# Patient Record
Sex: Female | Born: 1975 | Race: Black or African American | Hispanic: No | Smoking: Never smoker
Health system: Southern US, Community
[De-identification: ages and names within clinical notes are randomized; demographics above are authoritative.]

## PROBLEM LIST (undated history)

## (undated) DIAGNOSIS — N920 Excessive and frequent menstruation with regular cycle: Secondary | ICD-10-CM

## (undated) DIAGNOSIS — D649 Anemia, unspecified: Secondary | ICD-10-CM

## (undated) DIAGNOSIS — I82409 Acute embolism and thrombosis of unspecified deep veins of unspecified lower extremity: Secondary | ICD-10-CM

## (undated) DIAGNOSIS — Z5189 Encounter for other specified aftercare: Secondary | ICD-10-CM

## (undated) HISTORY — DX: Excessive and frequent menstruation with regular cycle: N92.0

## (undated) HISTORY — DX: Acute embolism and thrombosis of unspecified deep veins of unspecified lower extremity: I82.409

## (undated) HISTORY — PX: OTHER SURGICAL HISTORY: SHX169

## (undated) HISTORY — DX: Anemia, unspecified: D64.9

---

## 2001-01-19 ENCOUNTER — Emergency Department (HOSPITAL_COMMUNITY): Admission: EM | Admit: 2001-01-19 | Discharge: 2001-01-19 | Payer: Self-pay | Admitting: *Deleted

## 2002-01-13 ENCOUNTER — Emergency Department (HOSPITAL_COMMUNITY): Admission: EM | Admit: 2002-01-13 | Discharge: 2002-01-13 | Payer: Self-pay | Admitting: *Deleted

## 2002-04-11 ENCOUNTER — Emergency Department (HOSPITAL_COMMUNITY): Admission: EM | Admit: 2002-04-11 | Discharge: 2002-04-11 | Payer: Self-pay | Admitting: Emergency Medicine

## 2008-09-03 LAB — HM PAP SMEAR: HM Pap smear: NORMAL

## 2010-07-18 ENCOUNTER — Emergency Department (HOSPITAL_BASED_OUTPATIENT_CLINIC_OR_DEPARTMENT_OTHER)
Admission: EM | Admit: 2010-07-18 | Discharge: 2010-07-19 | Disposition: A | Discharge status: Home / Self Care | Attending: Emergency Medicine | Admitting: Emergency Medicine

## 2010-07-18 DIAGNOSIS — K089 Disorder of teeth and supporting structures, unspecified: Secondary | ICD-10-CM | POA: Insufficient documentation

## 2010-07-18 DIAGNOSIS — K029 Dental caries, unspecified: Secondary | ICD-10-CM | POA: Insufficient documentation

## 2010-07-18 DIAGNOSIS — E119 Type 2 diabetes mellitus without complications: Secondary | ICD-10-CM | POA: Insufficient documentation

## 2011-09-05 DIAGNOSIS — G4733 Obstructive sleep apnea (adult) (pediatric): Secondary | ICD-10-CM | POA: Insufficient documentation

## 2011-11-27 HISTORY — PX: GASTRIC BYPASS: SHX52

## 2011-12-25 ENCOUNTER — Encounter (HOSPITAL_COMMUNITY): Payer: Self-pay | Admitting: Emergency Medicine

## 2011-12-25 ENCOUNTER — Emergency Department (HOSPITAL_COMMUNITY)

## 2011-12-25 ENCOUNTER — Emergency Department (HOSPITAL_COMMUNITY)
Admission: EM | Admit: 2011-12-25 | Discharge: 2011-12-26 | Disposition: A | Discharge status: Home / Self Care | Attending: Emergency Medicine | Admitting: Emergency Medicine

## 2011-12-25 DIAGNOSIS — K9581 Infection due to other bariatric procedure: Secondary | ICD-10-CM | POA: Insufficient documentation

## 2011-12-25 DIAGNOSIS — Y838 Other surgical procedures as the cause of abnormal reaction of the patient, or of later complication, without mention of misadventure at the time of the procedure: Secondary | ICD-10-CM | POA: Insufficient documentation

## 2011-12-25 DIAGNOSIS — L03319 Cellulitis of trunk, unspecified: Secondary | ICD-10-CM | POA: Insufficient documentation

## 2011-12-25 DIAGNOSIS — N390 Urinary tract infection, site not specified: Secondary | ICD-10-CM | POA: Insufficient documentation

## 2011-12-25 DIAGNOSIS — K7689 Other specified diseases of liver: Secondary | ICD-10-CM | POA: Insufficient documentation

## 2011-12-25 DIAGNOSIS — L02219 Cutaneous abscess of trunk, unspecified: Secondary | ICD-10-CM | POA: Insufficient documentation

## 2011-12-25 DIAGNOSIS — R509 Fever, unspecified: Secondary | ICD-10-CM | POA: Insufficient documentation

## 2011-12-25 DIAGNOSIS — T8149XA Infection following a procedure, other surgical site, initial encounter: Secondary | ICD-10-CM

## 2011-12-25 LAB — URINALYSIS, ROUTINE W REFLEX MICROSCOPIC
Ketones, ur: >80 mg/dL — AB
Nitrite: NEGATIVE
Protein, ur: 100 mg/dL — AB
Urobilinogen, UA: 1 mg/dL (ref 0.0–1.0)

## 2011-12-25 LAB — CBC WITH DIFFERENTIAL/PLATELET
Basophils Absolute: 0 10*3/uL (ref 0.0–0.1)
Eosinophils Relative: 0 % (ref 0–5)
HCT: 35.7 % — ABNORMAL LOW (ref 36.0–46.0)
Lymphs Abs: 1.6 10*3/uL (ref 0.7–4.0)
MCH: 24.9 pg — ABNORMAL LOW (ref 26.0–34.0)
MCV: 76 fL — ABNORMAL LOW (ref 78.0–100.0)
Monocytes Absolute: 1 10*3/uL (ref 0.1–1.0)
Monocytes Relative: 6 % (ref 3–12)
Neutro Abs: 13.8 10*3/uL — ABNORMAL HIGH (ref 1.7–7.7)
Platelets: 289 10*3/uL (ref 150–400)
RDW: 16.1 % — ABNORMAL HIGH (ref 11.5–15.5)

## 2011-12-25 LAB — URINE MICROSCOPIC-ADD ON

## 2011-12-25 LAB — COMPREHENSIVE METABOLIC PANEL
ALT: 15 U/L (ref 0–35)
Alkaline Phosphatase: 67 U/L (ref 39–117)
BUN: 4 mg/dL — ABNORMAL LOW (ref 6–23)
CO2: 25 mEq/L (ref 19–32)
GFR calc Af Amer: >90 mL/min (ref >90)
GFR calc non Af Amer: >90 mL/min (ref >90)
Glucose, Bld: 110 mg/dL — ABNORMAL HIGH (ref 70–99)
Potassium: 3 mEq/L — ABNORMAL LOW (ref 3.5–5.1)
Sodium: 138 mEq/L (ref 135–145)
Total Protein: 7.9 g/dL (ref 6.0–8.3)

## 2011-12-25 MED ORDER — SODIUM CHLORIDE 0.9 % IV BOLUS (SEPSIS)
1000.0000 mL | Freq: Once | INTRAVENOUS | Status: AC
  Administered 2011-12-25: 1000 mL via INTRAVENOUS

## 2011-12-25 MED ORDER — IOHEXOL 300 MG/ML  SOLN: 20.0000 mL | Freq: Once | INTRAMUSCULAR | Status: DC | PRN

## 2011-12-25 MED ORDER — IOHEXOL 300 MG/ML  SOLN
100.0000 mL | Freq: Once | INTRAMUSCULAR | Status: AC | PRN
  Administered 2011-12-25: 100 mL via INTRAVENOUS

## 2011-12-25 MED ORDER — ACETAMINOPHEN 325 MG PO TABS
650.0000 mg | ORAL_TABLET | Freq: Once | ORAL | Status: AC
  Administered 2011-12-25: 650 mg via ORAL
  Filled 2011-12-25: qty 2

## 2011-12-25 MED ORDER — IOHEXOL 300 MG/ML  SOLN
20.0000 mL | Freq: Once | INTRAMUSCULAR | Status: AC | PRN
  Administered 2011-12-25: 20 mL via ORAL

## 2011-12-25 NOTE — ED Provider Notes (Signed)
History     CSN: [id number]  Arrival date & time 12/25/11  1659   First MD Initiated Contact with Patient 12/25/11 1927      Chief Complaint  Patient presents with  . Wound Infection    (Consider location/radiation/quality/duration/timing/severity/associated sxs/prior treatment) Patient is a 36 y.o. female presenting with abdominal pain. The history is provided by the patient.  Abdominal Pain The primary symptoms of the illness include abdominal pain, fever, fatigue and nausea. The primary symptoms of the illness do not include shortness of breath, vomiting, diarrhea, hematemesis, hematochezia, dysuria or vaginal discharge. The current episode started yesterday. The onset of the illness was gradual. The problem has been gradually worsening.  The abdominal pain is located in the RUQ. The abdominal pain does not radiate.  Risk factors for an acute abdominal problem include a history of abdominal surgery. Additional symptoms associated with the illness include chills and anorexia. Symptoms associated with the illness do not include diaphoresis, hematuria or back pain.    History reviewed. No pertinent past medical history.  Past Surgical History  Procedure Date  . Gastric bypass     History reviewed. No pertinent family history.  History  Substance Use Topics  . Smoking status: Never Smoker   . Smokeless tobacco: Not on file  . Alcohol Use: No    OB History    Grav Para Term Preterm Abortions TAB SAB Ect Mult Living                  Review of Systems  Constitutional: Positive for fever, chills and fatigue. Negative for diaphoresis.  HENT: Negative for ear pain, congestion, sore throat, facial swelling, mouth sores, trouble swallowing, neck pain and neck stiffness.   Eyes: Negative.   Respiratory: Negative for apnea, cough, chest tightness, shortness of breath and wheezing.   Cardiovascular: Negative for chest pain, palpitations and leg swelling.  Gastrointestinal:  Positive for nausea, abdominal pain and anorexia. Negative for vomiting, diarrhea, blood in stool, hematochezia, abdominal distention and hematemesis.  Genitourinary: Negative for dysuria, hematuria, flank pain, vaginal discharge, difficulty urinating and menstrual problem.  Musculoskeletal: Negative for back pain and gait problem.  Skin: Positive for rash. Negative for wound.  Neurological: Negative for dizziness, tremors, seizures, syncope, facial asymmetry, numbness and headaches.  Psychiatric/Behavioral: Negative.   All other systems reviewed and are negative.    Allergies  Review of patient's allergies indicates no known allergies.  Home Medications   Current Outpatient Rx  Name Route Sig Dispense Refill  . DOCUSATE SODIUM 100 MG PO CAPS Oral Take 100 mg by mouth 2 (two) times daily.    Marland Kitchen HYDROCODONE-ACETAMINOPHEN 7.5-325 MG/15ML PO SOLN Oral Take 15 mLs by mouth 4 (four) times daily as needed. For pain    . OMEPRAZOLE 20 MG PO CPDR Oral Take 20 mg by mouth daily.    Marland Kitchen PROMETHAZINE HCL 25 MG PO TABS Oral Take 25 mg by mouth every 6 (six) hours as needed. For nausea    . URSODIOL 300 MG PO CAPS Oral Take 300 mg by mouth 2 (two) times daily.      BP 102/62  Pulse 93  Temp 98.1 F (36.7 C) (Oral)  Resp 33  SpO2 97%  LMP 09/25/2011  Physical Exam  Nursing note and vitals reviewed. Constitutional: She is oriented to person, place, and time. She appears well-developed and well-nourished. No distress.  HENT:  Head: Normocephalic and atraumatic.  Right Ear: External ear normal.  Left Ear: External ear normal.  Nose: Nose normal.  Mouth/Throat: Oropharynx is clear and moist. No oropharyngeal exudate.  Eyes: Conjunctivae and EOM are normal. Pupils are equal, round, and reactive to light. Right eye exhibits no discharge. Left eye exhibits no discharge.  Neck: Normal range of motion. Neck supple. No JVD present. No tracheal deviation present. No thyromegaly present.    Cardiovascular: Normal rate, regular rhythm, normal heart sounds and intact distal pulses.  Exam reveals no gallop and no friction rub.   No murmur heard. Pulmonary/Chest: Effort normal and breath sounds normal. No respiratory distress. She has no wheezes. She has no rales. She exhibits no tenderness.  Abdominal: Soft. Bowel sounds are normal. She exhibits no distension. There is tenderness (tenderness to palpation around surgical incision). There is no rebound and no guarding.         Patient with pain with palpation around surgical incision. Surgical incision is surrounded by warmth erythema induration but no obvious fluctuance. Surgical wound with purulent discharge  Musculoskeletal: Normal range of motion.  Lymphadenopathy:    She has no cervical adenopathy.  Neurological: She is alert and oriented to person, place, and time. No cranial nerve deficit. Coordination normal.  Skin: Skin is warm. No rash noted. She is not diaphoretic.  Psychiatric: She has a normal mood and affect. Her behavior is normal. Judgment and thought content normal.    ED Course  Procedures (including critical care time)  Labs Reviewed  CBC WITH DIFFERENTIAL - Abnormal; Notable for the following:    WBC 16.4 (*)     Hemoglobin 11.7 (*)     HCT 35.7 (*)     MCV 76.0 (*)     MCH 24.9 (*)     RDW 16.1 (*)     Neutrophils Relative 84 (*)     Lymphocytes Relative 10 (*)     Neutro Abs 13.8 (*)     All other components within normal limits  COMPREHENSIVE METABOLIC PANEL - Abnormal; Notable for the following:    Potassium 3.0 (*)     Glucose, Bld 110 (*)     BUN 4 (*)     Albumin 2.8 (*)     All other components within normal limits  URINALYSIS, ROUTINE W REFLEX MICROSCOPIC - Abnormal; Notable for the following:    Color, Urine AMBER (*)  BIOCHEMICALS MAY BE AFFECTED BY COLOR   APPearance CLOUDY (*)     Bilirubin Urine MODERATE (*)     Ketones, ur >80 (*)     Protein, ur 100 (*)     Leukocytes, UA SMALL  (*)     All other components within normal limits  URINE MICROSCOPIC-ADD ON - Abnormal; Notable for the following:    Squamous Epithelial / LPF FEW (*)     Bacteria, UA MANY (*)     Casts HYALINE CASTS (*)  GRANULAR CAST   All other components within normal limits   Ct Abdomen Pelvis W Contrast  12/25/2011  *RADIOLOGY REPORT*  Clinical Data: Recent gastric bypass surgery, fever, abdominal pain and drainage from surgical site.  CT ABDOMEN AND PELVIS WITH CONTRAST  Technique:  Multidetector CT imaging of the abdomen and pelvis was performed following the standard protocol during bolus administration of intravenous contrast.  Contrast: 20mL OMNIPAQUE IOHEXOL 300 MG/ML  SOLN, OMNIPAQUE IOHEXOL 300 MG/ML  SOLN  Comparison: None.  Findings: Limited images through the lung bases demonstrate no significant appreciable abnormality. The heart size is within normal limits. No pleural or pericardial effusion.  Hepatic steatosis.  Punctate calcification within the right hepatic lobe is nonspecific.  Distended gallbladder.  No pericholecystic fluid or radiodense gallstones.  Splenomegaly, measuring 15 cm craniocaudal.  Unremarkable pancreas and adrenal glands.  Symmetric renal enhancement.  No hydronephrosis or hydroureter.  No bowel obstruction.  No CT evidence for colitis.  There is mild colonic diverticulosis.  Status post gastric bypass.  Subcentimeter retroperitoneal lymph nodes are nonspecific.  No free intraperitoneal air or fluid.  Normal caliber vasculature.  Partially decompressed bladder.  Unremarkable CT appearance to the uterus and adnexa. Mildly prominent right inguinal lymph nodes, presumably reactive.  No acute osseous finding.  Mild thickening of the right rectus abdominus musculature.  Ill-defined fluid within the subcutaneous fat of the right anterior abdomen.  IMPRESSION: Ill-defined fluid within the right anterior subcutaneous fat with stranding of the surrounding subcutaneous fat. This may  represent cellulitis and/or postoperative changes. A developing abscess is not excluded, though no well defined/walled off collection at this time.  No intraperitoneal communication.  Hepatic steatosis.  Splenomegaly.  Original Report Authenticated By: Waneta Martins, M.D.     No diagnosis found.    MDM  36 year old female who had gastric bypass surgery one week ago presents with surgical wound infection. Patient noticed symptoms yesterday such as worsening redness induration pain and purulent discharge around surgical wound in right upper quadrant of abdomen. Patient also feeling nauseated but no vomiting patient with fevers. Patient denies urinary symptoms. Patient was some evidence of possible urinary tract infection but does not experience hesitancy frequency or dysuria. Patient with no cough congestion or signs of possible pneumonia as cause of infection. We'll get CT of wound and likely patient will need transporting to [facility] where her primary surgeons are located.  12:36 AM I spoke with Dr.Reid the minimally invasive surgery fellow at [facility] who spoke with Dr. Lowell Guitar the attending surgeon physician on call about this case. After lengthy conversation given patient's hemodynamic stability and well appearance and normalizing vital signs with no evidence of an abscess or intra-abdominal pathology patient will be given a dose of IV Ancef to cover possible sources of the cellulitic postoperative infection and IV ciprofloxacin for her evidence of possible postoperative urinary tract infection. Surgeon's will work patient into their schedule in the office tomorrow. I will instruct patient to call to make an appointment tomorrow and to return immediately to the emergency department with worsening pain nausea vomiting worsening drainage. Patient is in accordance with this plan  Results for orders placed during the hospital encounter of 12/25/11  CBC WITH DIFFERENTIAL       Component Value Range   WBC 16.4 (*) 4.0 - 10.5 K/uL   RBC 4.70  3.87 - 5.11 MIL/uL   Hemoglobin 11.7 (*) 12.0 - 15.0 g/dL   HCT 16.1 (*) 09.6 - 04.5 %   MCV 76.0 (*) 78.0 - 100.0 fL   MCH 24.9 (*) 26.0 - 34.0 pg   MCHC 32.8  30.0 - 36.0 g/dL   RDW 40.9 (*) 81.1 - 91.4 %   Platelets 289  150 - 400 K/uL   Neutrophils Relative 84 (*) 43 - 77 %   Lymphocytes Relative 10 (*) 12 - 46 %   Monocytes Relative 6  3 - 12 %   Eosinophils Relative 0  0 - 5 %   Basophils Relative 0  0 - 1 %   Neutro Abs 13.8 (*) 1.7 - 7.7 K/uL   Lymphs Abs 1.6  0.7 -  4.0 K/uL   Monocytes Absolute 1.0  0.1 - 1.0 K/uL   Eosinophils Absolute 0.0  0.0 - 0.7 K/uL   Basophils Absolute 0.0  0.0 - 0.1 K/uL   RBC Morphology POLYCHROMASIA PRESENT     WBC Morphology ATYPICAL LYMPHOCYTES     Smear Review LARGE PLATELETS PRESENT    COMPREHENSIVE METABOLIC PANEL      Component Value Range   Sodium 138  135 - 145 mEq/L   Potassium 3.0 (*) 3.5 - 5.1 mEq/L   Chloride 97  96 - 112 mEq/L   CO2 25  19 - 32 mEq/L   Glucose, Bld 110 (*) 70 - 99 mg/dL   BUN 4 (*) 6 - 23 mg/dL   Creatinine, Ser 1.61  0.50 - 1.10 mg/dL   Calcium 9.0  8.4 - 09.6 mg/dL   Total Protein 7.9  6.0 - 8.3 g/dL   Albumin 2.8 (*) 3.5 - 5.2 g/dL   AST 18  0 - 37 U/L   ALT 15  0 - 35 U/L   Alkaline Phosphatase 67  39 - 117 U/L   Total Bilirubin 0.5  0.3 - 1.2 mg/dL   GFR calc non Af Amer >90  >90 mL/min   GFR calc Af Amer >90  >90 mL/min  URINALYSIS, ROUTINE W REFLEX MICROSCOPIC      Component Value Range   Color, Urine AMBER (*) YELLOW   APPearance CLOUDY (*) CLEAR   Specific Gravity, Urine 1.028  1.005 - 1.030   pH 6.0  5.0 - 8.0   Glucose, UA NEGATIVE  NEGATIVE mg/dL   Hgb urine dipstick NEGATIVE  NEGATIVE   Bilirubin Urine MODERATE (*) NEGATIVE   Ketones, ur >80 (*) NEGATIVE mg/dL   Protein, ur 045 (*) NEGATIVE mg/dL   Urobilinogen, UA 1.0  0.0 - 1.0 mg/dL   Nitrite NEGATIVE  NEGATIVE   Leukocytes, UA SMALL (*) NEGATIVE  URINE  MICROSCOPIC-ADD ON      Component Value Range   Squamous Epithelial / LPF FEW (*) RARE   WBC, UA 7-10  <3 WBC/hpf   RBC / HPF 0-2  <3 RBC/hpf   Bacteria, UA MANY (*) RARE   Casts HYALINE CASTS (*) NEGATIVE   Urine-Other MUCOUS PRESENT         CT Abdomen Pelvis W Contrast (Final result)   Result time:12/25/11 2303    Final result by Rad Results In Interface (12/25/11 23:03:09)    Narrative:   *RADIOLOGY REPORT*  Clinical Data: Recent gastric bypass surgery, fever, abdominal pain and drainage from surgical site.  CT ABDOMEN AND PELVIS WITH CONTRAST  Technique: Multidetector CT imaging of the abdomen and pelvis was performed following the standard protocol during bolus administration of intravenous contrast.  Contrast: 20mL OMNIPAQUE IOHEXOL 300 MG/ML SOLN, OMNIPAQUE IOHEXOL 300 MG/ML SOLN  Comparison: None.  Findings: Limited images through the lung bases demonstrate no significant appreciable abnormality. The heart size is within normal limits. No pleural or pericardial effusion.  Hepatic steatosis. Punctate calcification within the right hepatic lobe is nonspecific. Distended gallbladder. No pericholecystic fluid or radiodense gallstones. Splenomegaly, measuring 15 cm craniocaudal. Unremarkable pancreas and adrenal glands.  Symmetric renal enhancement. No hydronephrosis or hydroureter.  No bowel obstruction. No CT evidence for colitis. There is mild colonic diverticulosis. Status post gastric bypass.  Subcentimeter retroperitoneal lymph nodes are nonspecific. No free intraperitoneal air or fluid.  Normal caliber vasculature.  Partially decompressed bladder. Unremarkable CT appearance to the uterus and adnexa. Mildly prominent right  inguinal lymph nodes, presumably reactive.  No acute osseous finding. Mild thickening of the right rectus abdominus musculature.  Ill-defined fluid within the subcutaneous fat of the right  anterior abdomen.  IMPRESSION: Ill-defined fluid within the right anterior subcutaneous fat with stranding of the surrounding subcutaneous fat. This may represent cellulitis and/or postoperative changes. A developing abscess is not excluded, though no well defined/walled off collection at this time. No intraperitoneal communication.  Hepatic steatosis.  Splenomegaly.  Original Report Authenticated By: Waneta Martins, M.D.     Case discussed with Dr. Bobby Rumpf, MD 12/26/11 [phone]

## 2011-12-25 NOTE — ED Notes (Signed)
States had gastric bypass 7/17 now having redness and fever not feeling well

## 2011-12-25 NOTE — ED Provider Notes (Signed)
I have seen and examined this patient with the resident.  I agree with the resident's note, assessment and plan except as indicated.    Patient with gastric bypass surgery performed one week ago at [facility]  Patient presents now with fever, tachycardia and some abdominal pain around her incision site.  She has noted redness and tenderness with around the site with some drainage present as well.  Patient will have his CT of her abdomen and pelvis performed to assess for correlating intra-abdominal infection.  I anticipate possible transfer to [facility] for further reevaluation by her surgeon.  Nat Christen, MD 12/25/11 2136

## 2011-12-25 NOTE — ED Notes (Signed)
Patient transported to CT 

## 2011-12-26 MED ORDER — CIPROFLOXACIN IN D5W 400 MG/200ML IV SOLN
400.0000 mg | Freq: Once | INTRAVENOUS | Status: AC
  Administered 2011-12-26: 400 mg via INTRAVENOUS
  Filled 2011-12-26: qty 200

## 2011-12-26 MED ORDER — CEPHALEXIN 500 MG PO CAPS: 500.0000 mg | ORAL_CAPSULE | Freq: Four times a day (QID) | ORAL | Status: AC

## 2011-12-26 MED ORDER — CEFAZOLIN SODIUM 1-5 GM-% IV SOLN
1.0000 g | Freq: Three times a day (TID) | INTRAVENOUS | Status: DC
  Administered 2011-12-26: 1 g via INTRAVENOUS
  Filled 2011-12-26: qty 50

## 2011-12-26 MED ORDER — CIPROFLOXACIN HCL 500 MG PO TABS: 500.0000 mg | ORAL_TABLET | Freq: Two times a day (BID) | ORAL | Status: AC

## 2011-12-28 NOTE — ED Provider Notes (Signed)
I have seen and examined this patient with the resident.  I agree with the resident's note, assessment and plan except as indicated.     Nat Christen, MD 12/28/11 [phone]

## 2012-01-01 DIAGNOSIS — Z9884 Bariatric surgery status: Secondary | ICD-10-CM | POA: Insufficient documentation

## 2012-04-19 ENCOUNTER — Emergency Department (HOSPITAL_BASED_OUTPATIENT_CLINIC_OR_DEPARTMENT_OTHER)
Admission: EM | Admit: 2012-04-19 | Discharge: 2012-04-19 | Disposition: A | Discharge status: Home / Self Care | Attending: Emergency Medicine | Admitting: Emergency Medicine

## 2012-04-19 ENCOUNTER — Encounter (HOSPITAL_BASED_OUTPATIENT_CLINIC_OR_DEPARTMENT_OTHER): Payer: Self-pay | Admitting: *Deleted

## 2012-04-19 DIAGNOSIS — R110 Nausea: Secondary | ICD-10-CM | POA: Insufficient documentation

## 2012-04-19 DIAGNOSIS — Z9884 Bariatric surgery status: Secondary | ICD-10-CM | POA: Insufficient documentation

## 2012-04-19 DIAGNOSIS — N390 Urinary tract infection, site not specified: Secondary | ICD-10-CM | POA: Insufficient documentation

## 2012-04-19 DIAGNOSIS — Z79899 Other long term (current) drug therapy: Secondary | ICD-10-CM | POA: Insufficient documentation

## 2012-04-19 DIAGNOSIS — M545 Low back pain, unspecified: Secondary | ICD-10-CM | POA: Insufficient documentation

## 2012-04-19 DIAGNOSIS — R350 Frequency of micturition: Secondary | ICD-10-CM | POA: Insufficient documentation

## 2012-04-19 LAB — URINALYSIS, ROUTINE W REFLEX MICROSCOPIC
Glucose, UA: NEGATIVE mg/dL
Ketones, ur: 15 mg/dL — AB
Nitrite: NEGATIVE
Specific Gravity, Urine: 1.029 (ref 1.005–1.030)
pH: 5 (ref 5.0–8.0)

## 2012-04-19 LAB — URINE MICROSCOPIC-ADD ON

## 2012-04-19 LAB — PREGNANCY, URINE: Preg Test, Ur: NEGATIVE

## 2012-04-19 MED ORDER — NITROFURANTOIN MONOHYD MACRO 100 MG PO CAPS
100.0000 mg | ORAL_CAPSULE | Freq: Once | ORAL | Status: AC
  Administered 2012-04-19: 100 mg via ORAL
  Filled 2012-04-19: qty 1

## 2012-04-19 MED ORDER — PHENAZOPYRIDINE HCL 100 MG PO TABS
200.0000 mg | ORAL_TABLET | Freq: Once | ORAL | Status: AC
  Administered 2012-04-19: 200 mg via ORAL
  Filled 2012-04-19: qty 2

## 2012-04-19 MED ORDER — NITROFURANTOIN MONOHYD MACRO 100 MG PO CAPS: 100.0000 mg | ORAL_CAPSULE | Freq: Two times a day (BID) | ORAL | Status: DC

## 2012-04-19 MED ORDER — PHENAZOPYRIDINE HCL 200 MG PO TABS: 200.0000 mg | ORAL_TABLET | Freq: Three times a day (TID) | ORAL | Status: DC

## 2012-04-19 NOTE — ED Provider Notes (Signed)
History     CSN: [id number]  Arrival date & time 04/19/12  0153   First MD Initiated Contact with Patient 04/19/12 0214      Chief Complaint  Patient presents with  . Urinary Frequency    (Consider location/radiation/quality/duration/timing/severity/associated sxs/prior treatment) HPI This is a 36 year old female several months status post bariatric surgery. She is here with a one-day history of burning with urination and urinary frequency. Her symptoms are mild. They're associated with some low back pain, mild nausea but no fever. She was told that her risk for urinary tract infections is increased due to her bariatric surgery. There no specific mitigating or exacerbating factors.  History reviewed. No pertinent past medical history.  Past Surgical History  Procedure Date  . Gastric bypass     History reviewed. No pertinent family history.  History  Substance Use Topics  . Smoking status: Never Smoker   . Smokeless tobacco: Not on file  . Alcohol Use: No    OB History    Grav Para Term Preterm Abortions TAB SAB Ect Mult Living                  Review of Systems  All other systems reviewed and are negative.    Allergies  Review of patient's allergies indicates no known allergies.  Home Medications   Current Outpatient Rx  Name  Route  Sig  Dispense  Refill  . OMEPRAZOLE 20 MG PO CPDR   Oral   Take 20 mg by mouth daily.         Marland Kitchen DOCUSATE SODIUM 100 MG PO CAPS   Oral   Take 100 mg by mouth 2 (two) times daily.         Marland Kitchen HYDROCODONE-ACETAMINOPHEN 7.5-325 MG/15ML PO SOLN   Oral   Take 15 mLs by mouth 4 (four) times daily as needed. For pain         . PROMETHAZINE HCL 25 MG PO TABS   Oral   Take 25 mg by mouth every 6 (six) hours as needed. For nausea         . URSODIOL 300 MG PO CAPS   Oral   Take 300 mg by mouth 2 (two) times daily.           BP 113/73  Pulse 73  Temp 97.1 F (36.2 C) (Oral)  Resp 22  Ht 5\' 4"  (1.626 m)  Wt 351  lb (159.213 kg)  BMI 60.25 kg/m2  SpO2 99%  LMP 03/05/2012  Physical Exam General: Well-developed, morbidly obese female in no acute distress; appearance consistent with age of record HENT: normocephalic, atraumatic Eyes: pupils equal round and reactive to light; extraocular muscles intact Neck: supple Heart: regular rate and rhythm Lungs: clear to auscultation bilaterally Abdomen: soft; obese; mild suprapubic; bowel sounds present GU: No CVA tenderness Extremities: No deformity; full range of motion Neurologic: Awake, alert and oriented; motor function intact in all extremities and symmetric; no facial droop Skin: Warm and dry Psychiatric: Normal mood and affect    ED Course  Procedures (including critical care time)     MDM   Nursing notes and vitals signs, including pulse oximetry, reviewed.  Summary of this visit's results, reviewed by myself:  Labs:  Results for orders placed during the hospital encounter of 04/19/12  URINALYSIS, ROUTINE W REFLEX MICROSCOPIC      Component Value Range   Color, Urine YELLOW  YELLOW   APPearance CLOUDY (*) CLEAR   Specific Gravity,  Urine 1.029  1.005 - 1.030   pH 5.0  5.0 - 8.0   Glucose, UA NEGATIVE  NEGATIVE mg/dL   Hgb urine dipstick NEGATIVE  NEGATIVE   Bilirubin Urine SMALL (*) NEGATIVE   Ketones, ur 15 (*) NEGATIVE mg/dL   Protein, ur NEGATIVE  NEGATIVE mg/dL   Urobilinogen, UA 1.0  0.0 - 1.0 mg/dL   Nitrite NEGATIVE  NEGATIVE   Leukocytes, UA MODERATE (*) NEGATIVE  PREGNANCY, URINE      Component Value Range   Preg Test, Ur NEGATIVE  NEGATIVE  URINE MICROSCOPIC-ADD ON      Component Value Range   Squamous Epithelial / LPF MANY (*) RARE   WBC, UA 11-20  <3 WBC/hpf   RBC / HPF 0-2  <3 RBC/hpf   Bacteria, UA MANY (*) RARE   Urine-Other MUCOUS PRESENT             Hanley Seamen, MD 04/19/12 [phone]

## 2012-04-19 NOTE — ED Notes (Signed)
MD at bedside. 

## 2012-04-19 NOTE — ED Notes (Signed)
C/o urinary frequency and burning with urination since yesterday.

## 2012-04-20 LAB — URINE CULTURE: Colony Count: 15000

## 2012-04-21 NOTE — ED Notes (Signed)
+  Urine. Patient given Macrobid. Intermediate. Chart sent to EDP office for review. °

## 2012-04-24 NOTE — ED Notes (Signed)
Chart returned from EDP office  With order written by Rhea Bleacher to switch to  Bactrim DS one tab twice a day for 3 days.

## 2012-04-26 ENCOUNTER — Telehealth (HOSPITAL_COMMUNITY): Payer: Self-pay | Admitting: Emergency Medicine

## 2012-04-26 NOTE — ED Notes (Signed)
Rx called in to [facility] on [facility] [address] by Jaci Lazier PFM.

## 2012-09-23 ENCOUNTER — Ambulatory Visit (INDEPENDENT_AMBULATORY_CARE_PROVIDER_SITE_OTHER): Admitting: Obstetrics & Gynecology

## 2012-09-23 ENCOUNTER — Encounter: Payer: Self-pay | Admitting: Obstetrics & Gynecology

## 2012-09-23 VITALS — BP 119/79 | HR 62 | Temp 97.0°F | Ht 64.0 in | Wt 325.0 lb

## 2012-09-23 DIAGNOSIS — Z113 Encounter for screening for infections with a predominantly sexual mode of transmission: Secondary | ICD-10-CM

## 2012-09-23 DIAGNOSIS — Z01419 Encounter for gynecological examination (general) (routine) without abnormal findings: Secondary | ICD-10-CM | POA: Insufficient documentation

## 2012-09-23 NOTE — Progress Notes (Signed)
Subjective:     Shelby Gardner is a 37 y.o. female here for a routine exam.  Current complaints: n/a.  Personal health questionnaire reviewed: no.   Gynecologic History Patient's last menstrual period was 09/05/2012. Contraception: none Last Pap: 09/03/2008. Results were: normal Last mammogram: n/a. Results were: n/a    The following portions of the patient's history were reviewed and updated as appropriate: allergies, current medications, past family history, past medical history, past social history, past surgical history and problem list.  Review of Systems Pertinent items are noted in HPI.    Objective:      General appearance: alert Breasts: normal appearance, no masses or tenderness Abdomen: large pannus, asymmetrical--R/L, peau d'Or[place] skin  Pelvic: cervix normal in appearance, external genitalia normal, bimanual exam limited by body habitus and vagina normal without discharge    Assessment:    Healthy female exam.    Plan:   Return prn

## 2012-09-23 NOTE — Patient Instructions (Signed)

## 2012-09-25 LAB — PAP IG, CT-NG NAA, HPV HIGH-RISK
Chlamydia Probe Amp: NEGATIVE
GC Probe Amp: NEGATIVE

## 2012-09-27 ENCOUNTER — Encounter: Payer: Self-pay | Admitting: Obstetrics & Gynecology

## 2012-09-28 ENCOUNTER — Encounter: Payer: Self-pay | Admitting: Obstetrics & Gynecology

## 2012-12-27 DIAGNOSIS — M793 Panniculitis, unspecified: Secondary | ICD-10-CM | POA: Insufficient documentation

## 2013-01-14 DIAGNOSIS — E538 Deficiency of other specified B group vitamins: Secondary | ICD-10-CM | POA: Insufficient documentation

## 2013-01-14 DIAGNOSIS — I82409 Acute embolism and thrombosis of unspecified deep veins of unspecified lower extremity: Secondary | ICD-10-CM | POA: Insufficient documentation

## 2013-01-14 DIAGNOSIS — E559 Vitamin D deficiency, unspecified: Secondary | ICD-10-CM | POA: Insufficient documentation

## 2013-01-14 DIAGNOSIS — D509 Iron deficiency anemia, unspecified: Secondary | ICD-10-CM | POA: Insufficient documentation

## 2013-04-03 ENCOUNTER — Other Ambulatory Visit: Payer: Self-pay

## 2013-05-06 ENCOUNTER — Encounter (HOSPITAL_BASED_OUTPATIENT_CLINIC_OR_DEPARTMENT_OTHER): Payer: Self-pay | Admitting: Emergency Medicine

## 2013-05-06 ENCOUNTER — Inpatient Hospital Stay (HOSPITAL_BASED_OUTPATIENT_CLINIC_OR_DEPARTMENT_OTHER)
Admission: AD | Admit: 2013-05-06 | Discharge: 2013-05-09 | DRG: 812 | Disposition: A | Discharge status: Home / Self Care | Source: Ambulatory Visit | Attending: Obstetrics | Admitting: Obstetrics

## 2013-05-06 DIAGNOSIS — N938 Other specified abnormal uterine and vaginal bleeding: Secondary | ICD-10-CM | POA: Diagnosis present

## 2013-05-06 DIAGNOSIS — Z9884 Bariatric surgery status: Secondary | ICD-10-CM

## 2013-05-06 DIAGNOSIS — Z86718 Personal history of other venous thrombosis and embolism: Secondary | ICD-10-CM

## 2013-05-06 DIAGNOSIS — I82409 Acute embolism and thrombosis of unspecified deep veins of unspecified lower extremity: Secondary | ICD-10-CM

## 2013-05-06 DIAGNOSIS — N949 Unspecified condition associated with female genital organs and menstrual cycle: Secondary | ICD-10-CM | POA: Diagnosis present

## 2013-05-06 DIAGNOSIS — D649 Anemia, unspecified: Secondary | ICD-10-CM | POA: Diagnosis present

## 2013-05-06 DIAGNOSIS — N939 Abnormal uterine and vaginal bleeding, unspecified: Secondary | ICD-10-CM

## 2013-05-06 DIAGNOSIS — I498 Other specified cardiac arrhythmias: Secondary | ICD-10-CM | POA: Diagnosis present

## 2013-05-06 DIAGNOSIS — D259 Leiomyoma of uterus, unspecified: Secondary | ICD-10-CM | POA: Diagnosis present

## 2013-05-06 DIAGNOSIS — N926 Irregular menstruation, unspecified: Secondary | ICD-10-CM

## 2013-05-06 LAB — URINALYSIS, ROUTINE W REFLEX MICROSCOPIC
Glucose, UA: NEGATIVE mg/dL
Hgb urine dipstick: NEGATIVE
Leukocytes, UA: NEGATIVE
Protein, ur: NEGATIVE mg/dL
Urobilinogen, UA: 1 mg/dL (ref 0.0–1.0)
pH: 5.5 (ref 5.0–8.0)

## 2013-05-06 LAB — BASIC METABOLIC PANEL
CO2: 22 mEq/L (ref 19–32)
Calcium: 8.3 mg/dL — ABNORMAL LOW (ref 8.4–10.5)
Chloride: 106 mEq/L (ref 96–112)
GFR calc Af Amer: >90 mL/min (ref >90)
Glucose, Bld: 146 mg/dL — ABNORMAL HIGH (ref 70–99)
Sodium: 139 mEq/L (ref 135–145)

## 2013-05-06 LAB — CBC
HCT: 13.9 % — ABNORMAL LOW (ref 36.0–46.0)
Hemoglobin: 4 g/dL — CL (ref 12.0–15.0)
MCHC: 28.8 g/dL — ABNORMAL LOW (ref 30.0–36.0)
RBC: 1.98 MIL/uL — ABNORMAL LOW (ref 3.87–5.11)
WBC: 7.5 10*3/uL (ref 4.0–10.5)

## 2013-05-06 LAB — CBC WITH DIFFERENTIAL/PLATELET
Basophils Relative: 1 % (ref 0–1)
Eosinophils Relative: 1 % (ref 0–5)
Hemoglobin: 4.3 g/dL — CL (ref 12.0–15.0)
Lymphocytes Relative: 20 % (ref 12–46)
Lymphs Abs: 2.1 10*3/uL (ref 0.7–4.0)
MCH: 20.7 pg — ABNORMAL LOW (ref 26.0–34.0)
MCV: 74 fL — ABNORMAL LOW (ref 78.0–100.0)
Monocytes Absolute: 0.8 10*3/uL (ref 0.1–1.0)
Monocytes Relative: 8 % (ref 3–12)
Platelets: 271 10*3/uL (ref 150–400)
RBC: 2.08 MIL/uL — ABNORMAL LOW (ref 3.87–5.11)
RDW: 18.7 % — ABNORMAL HIGH (ref 11.5–15.5)
WBC: 10.5 10*3/uL (ref 4.0–10.5)

## 2013-05-06 LAB — PREGNANCY, URINE: Preg Test, Ur: NEGATIVE

## 2013-05-06 LAB — PREPARE RBC (CROSSMATCH)

## 2013-05-06 MED ORDER — PRENATAL MULTIVITAMIN CH
1.0000 | ORAL_TABLET | Freq: Every day | ORAL | Status: DC
  Administered 2013-05-07 – 2013-05-09 (×3): 1 via ORAL
  Filled 2013-05-06 (×3): qty 1

## 2013-05-06 MED ORDER — RIVAROXABAN 20 MG PO TABS
20.0000 mg | ORAL_TABLET | Freq: Every day | ORAL | Status: DC
  Administered 2013-05-06: 20 mg via ORAL
  Filled 2013-05-06 (×2): qty 1

## 2013-05-06 MED ORDER — MEDROXYPROGESTERONE ACETATE 400 MG/ML IM SUSP
400.0000 mg | Freq: Once | INTRAMUSCULAR | Status: DC
  Filled 2013-05-06: qty 1

## 2013-05-06 MED ORDER — MEGESTROL ACETATE 40 MG PO TABS
40.0000 mg | ORAL_TABLET | Freq: Two times a day (BID) | ORAL | Status: DC
  Administered 2013-05-06 – 2013-05-09 (×6): 40 mg via ORAL
  Filled 2013-05-06 (×8): qty 1

## 2013-05-06 NOTE — ED Notes (Signed)
Dizziness, pale, and vaginal bleeding. She is extremely pale.

## 2013-05-06 NOTE — H&P (Signed)
Shelby Gardner is an 37 y.o. female.  [facility] with H/O vaginal bleeding since 03-29-13, now weak and dizzy, worse today.  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  She is on anticoagulation with Xarelto.  Patient's last menstrual period was 03/29/2013.    History reviewed. No pertinent past medical history.  Past Surgical History  Procedure Laterality Date  . Gastric bypass  July 2013    Family History  Problem Relation Age of Onset  . Diabetes Mother   . Cancer Father   . Heart disease Father     Social History:  reports that she has never smoked. She has never used smokeless tobacco. She reports that she does not drink alcohol or use illicit drugs.  Allergies: No Known Allergies  Prescriptions prior to admission  Medication Sig Dispense Refill  . ferrous sulfate 325 (65 FE) MG tablet Take 325 mg by mouth daily with breakfast.      . Multiple Vitamins-Minerals (MULTIVITAMIN WITH MINERALS) tablet Take 1 tablet by mouth daily.      . Progesterone Micronized (PROGESTERONE PO) Take by mouth.      . Rivaroxaban (XARELTO PO) Take by mouth.      . Vitamin D, Ergocalciferol, (DRISDOL) 50000 UNITS CAPS capsule Take 50,000 Units by mouth every 7 (seven) days.      Marland Kitchen omeprazole (PRILOSEC) 20 MG capsule Take 20 mg by mouth daily as needed.       . ursodiol (ACTIGALL) 300 MG capsule Take 300 mg by mouth 2 (two) times daily.        Review of Systems  All other systems reviewed and are negative.    Blood pressure 123/52, pulse 103, temperature 98.1 F (36.7 C), temperature source Oral, resp. rate 20, height 5\' 4"  (1.626 m), weight 286 lb (129.729 kg), last menstrual period 03/29/2013, SpO2 100.00%. Physical Exam  Constitutional: She is oriented to person, place, and time. She appears well-developed and well-nourished.  HENT:  Head: Normocephalic and atraumatic.  Eyes: Conjunctivae are normal. Pupils are equal, round, and  reactive to light.  Neck: Normal range of motion. Neck supple.  Cardiovascular: Normal rate and regular rhythm.   Respiratory: Effort normal and breath sounds normal.  GI: Soft.  Genitourinary: Vagina normal and uterus normal.  Musculoskeletal: Normal range of motion.  Neurological: She is alert and oriented to person, place, and time.  Skin: Skin is warm and dry.  Pale.  Psychiatric: She has a normal mood and affect. Her behavior is normal. Judgment and thought content normal.  :   Results for orders placed during the hospital encounter of 05/06/13 (from the past 24 hour(s))  CBC WITH DIFFERENTIAL     Status: Abnormal   Collection Time    05/06/13  6:35 PM      Result Value Range   WBC 10.5  4.0 - 10.5 K/uL   RBC 2.08 (*) 3.87 - 5.11 MIL/uL   Hemoglobin 4.3 (*) 12.0 - 15.0 g/dL   HCT 84.1 (*) 32.4 - 40.1 %   MCV 74.0 (*) 78.0 - 100.0 fL   MCH 20.7 (*) 26.0 - 34.0 pg   MCHC 27.9 (*) 30.0 - 36.0 g/dL   RDW 02.7 (*) 25.3 - 66.4 %   Platelets 271  150 - 400 K/uL   Neutrophils Relative % 70  43 - 77 %   Lymphocytes Relative 20  12 - 46 %   Monocytes Relative 8  3 -  12 %   Eosinophils Relative 1  0 - 5 %   Basophils Relative 1  0 - 1 %   Neutro Abs 7.4  1.7 - 7.7 K/uL   Lymphs Abs 2.1  0.7 - 4.0 K/uL   Monocytes Absolute 0.8  0.1 - 1.0 K/uL   Eosinophils Absolute 0.1  0.0 - 0.7 K/uL   Basophils Absolute 0.1  0.0 - 0.1 K/uL   RBC Morphology BASOPHILIC STIPPLING    BASIC METABOLIC PANEL     Status: Abnormal   Collection Time    05/06/13  6:35 PM      Result Value Range   Sodium 139  135 - 145 mEq/L   Potassium 3.6  3.5 - 5.1 mEq/L   Chloride 106  96 - 112 mEq/L   CO2 22  19 - 32 mEq/L   Glucose, Bld 146 (*) 70 - 99 mg/dL   BUN 11  6 - 23 mg/dL   Creatinine, Ser 6.04  0.50 - 1.10 mg/dL   Calcium 8.3 (*) 8.4 - 10.5 mg/dL   GFR calc non Af Amer >90  >90 mL/min   GFR calc Af Amer >90  >90 mL/min  URINALYSIS, ROUTINE W REFLEX MICROSCOPIC     Status: Abnormal   Collection  Time    05/06/13  7:20 PM      Result Value Range   Color, Urine YELLOW  YELLOW   APPearance CLEAR  CLEAR   Specific Gravity, Urine 1.025  1.005 - 1.030   pH 5.5  5.0 - 8.0   Glucose, UA NEGATIVE  NEGATIVE mg/dL   Hgb urine dipstick NEGATIVE  NEGATIVE   Bilirubin Urine SMALL (*) NEGATIVE   Ketones, ur NEGATIVE  NEGATIVE mg/dL   Protein, ur NEGATIVE  NEGATIVE mg/dL   Urobilinogen, UA 1.0  0.0 - 1.0 mg/dL   Nitrite NEGATIVE  NEGATIVE   Leukocytes, UA NEGATIVE  NEGATIVE  PREGNANCY, URINE     Status: None   Collection Time    05/06/13  7:20 PM      Result Value Range   Preg Test, Ur NEGATIVE  NEGATIVE  CBC     Status: Abnormal   Collection Time    05/06/13 10:10 PM      Result Value Range   WBC 7.5  4.0 - 10.5 K/uL   RBC 1.98 (*) 3.87 - 5.11 MIL/uL   Hemoglobin 4.0 (*) 12.0 - 15.0 g/dL   HCT 54.0 (*) 98.1 - 19.1 %   MCV 70.2 (*) 78.0 - 100.0 fL   MCH 20.2 (*) 26.0 - 34.0 pg   MCHC 28.8 (*) 30.0 - 36.0 g/dL   RDW 47.8 (*) 29.5 - 62.1 %   Platelets 215  150 - 400 K/uL    No results found.  Assessment/Plan: AUB.  Severe anemia.  Stable.  Will transfuse PRBC's.  Risks, complications and benefits explained to patient and she agrees to plan.  Smriti Barkow A 05/06/2013, 10:34 PM

## 2013-05-06 NOTE — ED Provider Notes (Signed)
CSN: [id number]     Arrival date & time 05/06/13  1821 History   First MD Initiated Contact with Patient 05/06/13 1834     Chief Complaint  Patient presents with  . Dizziness  . Vaginal Bleeding   (Consider location/radiation/quality/duration/timing/severity/associated sxs/prior Treatment) HPI Comments: Pt states that she has been having vaginal bleeding since November 1st:pt states that she was treated around thanksgiving with progesterone by her pcp, but it only stopped the bleeding for 4 days:pt states that she has been using a pad every 3 hours:pt states that she developed cp and sob today:denies history of anemia:pt has had dizziness over the last couple weeks  The history is provided by the patient. No language interpreter was used.    History reviewed. No pertinent past medical history. Past Surgical History  Procedure Laterality Date  . Gastric bypass  July 2013   Family History  Problem Relation Age of Onset  . Diabetes Mother   . Cancer Father   . Heart disease Father    History  Substance Use Topics  . Smoking status: Never Smoker   . Smokeless tobacco: Never Used  . Alcohol Use: No   OB History   Grav Para Term Preterm Abortions TAB SAB Ect Mult Living                 Review of Systems  Constitutional: Negative.   Respiratory: Positive for shortness of breath.   Cardiovascular: Positive for chest pain.    Allergies  Review of patient's allergies indicates no known allergies.  Home Medications   Current Outpatient Rx  Name  Route  Sig  Dispense  Refill  . Progesterone Micronized (PROGESTERONE PO)   Oral   Take by mouth.         . Rivaroxaban (XARELTO PO)   Oral   Take by mouth.         Marland Kitchen omeprazole (PRILOSEC) 20 MG capsule   Oral   Take 20 mg by mouth daily as needed.          . ursodiol (ACTIGALL) 300 MG capsule   Oral   Take 300 mg by mouth 2 (two) times daily.          BP 112/74  Pulse 132  Temp(Src) 98.2 F (36.8 C) (Oral)   Resp 18  Ht 5\' 4"  (1.626 m)  Wt 286 lb (129.729 kg)  BMI 49.07 kg/m2  SpO2 100%  LMP 03/29/2013 Physical Exam  Nursing note and vitals reviewed. Constitutional: She is oriented to person, place, and time. She appears well-developed and well-nourished.  Eyes: Conjunctivae and EOM are normal.  Neck: Neck supple.  Cardiovascular: Normal rate and regular rhythm.   Pulmonary/Chest: Effort normal and breath sounds normal. No respiratory distress.  Abdominal: Bowel sounds are normal.  Genitourinary:  Vaginal bleeding  Musculoskeletal: Normal range of motion.  Neurological: She is alert and oriented to person, place, and time.  Skin: There is pallor.    ED Course  Procedures (including critical care time) Labs Review Labs Reviewed  CBC WITH DIFFERENTIAL - Abnormal; Notable for the following:    RBC 2.08 (*)    Hemoglobin 4.3 (*)    HCT 15.4 (*)    MCV 74.0 (*)    MCH 20.7 (*)    MCHC 27.9 (*)    RDW 18.7 (*)    All other components within normal limits  BASIC METABOLIC PANEL - Abnormal; Notable for the following:    Glucose, Bld 146 (*)  Calcium 8.3 (*)    All other components within normal limits  URINALYSIS, ROUTINE W REFLEX MICROSCOPIC - Abnormal; Notable for the following:    Bilirubin Urine SMALL (*)    All other components within normal limits  PREGNANCY, URINE   Imaging Review No results found.  EKG Interpretation    Date/Time:  Tuesday May 06 2013 18:53:54 EST Ventricular Rate:  107 PR Interval:  156 QRS Duration: 82 QT Interval:  328 QTC Calculation: 437 R Axis:   45 Text Interpretation:  Sinus tachycardia Otherwise normal ECG Confirmed by DELOS  MD, DOUGLAS ([id number]) on 05/06/2013 7:14:51 PM            MDM   1. Anemia   2. Vaginal bleeding    Pt is symptomatic with continued vaginal bleeding:pt accepted by Dr. Clearance Coots [facility] to [facility]    Teressa Lower, NP 05/06/13 2009

## 2013-05-06 NOTE — ED Provider Notes (Signed)
Medical screening examination/treatment/procedure(s) were performed by non-physician practitioner and as supervising physician I was immediately available for consultation/collaboration.  EKG Interpretation    Date/Time:  Tuesday May 06 2013 18:53:54 EST Ventricular Rate:  107 PR Interval:  156 QRS Duration: 82 QT Interval:  328 QTC Calculation: 437 R Axis:   45 Text Interpretation:  Sinus tachycardia Otherwise normal ECG Confirmed by Malva Cogan  MD, Javonni Macke ([id number]) on 05/06/2013 7:14:51 PM             Geoffery Lyons, MD 05/06/13 2305

## 2013-05-07 LAB — CBC
Hemoglobin: 6.8 g/dL — CL (ref 12.0–15.0)
MCH: 24.4 pg — ABNORMAL LOW (ref 26.0–34.0)
Platelets: 191 10*3/uL (ref 150–400)
RBC: 2.79 MIL/uL — ABNORMAL LOW (ref 3.87–5.11)
RDW: 17.8 % — ABNORMAL HIGH (ref 11.5–15.5)
WBC: 8.5 10*3/uL (ref 4.0–10.5)

## 2013-05-07 NOTE — Progress Notes (Signed)
I called Dr. Tamela Oddi and informed her of pt's 1600 HGB 6.8.   Pt states her bleeding has lessened from home but she is still passing a few large clots.  At least 2 today.  She reports she feels better, no c/o dizziness c ambulation.  Dr. Enrigue Catena ordered a hold on pt's Xarelto until pt's vag bleeding stops. Cristie Hem RN

## 2013-05-07 NOTE — Progress Notes (Signed)
Ur chart review completed.  

## 2013-05-08 ENCOUNTER — Ambulatory Visit (HOSPITAL_COMMUNITY)

## 2013-05-08 DIAGNOSIS — Z7901 Long term (current) use of anticoagulants: Secondary | ICD-10-CM

## 2013-05-08 DIAGNOSIS — I82409 Acute embolism and thrombosis of unspecified deep veins of unspecified lower extremity: Secondary | ICD-10-CM

## 2013-05-08 DIAGNOSIS — D649 Anemia, unspecified: Principal | ICD-10-CM

## 2013-05-08 DIAGNOSIS — N898 Other specified noninflammatory disorders of vagina: Secondary | ICD-10-CM

## 2013-05-08 LAB — CBC
HCT: 20.7 % — ABNORMAL LOW (ref 36.0–46.0)
Hemoglobin: 6.7 g/dL — CL (ref 12.0–15.0)
MCH: 24.2 pg — ABNORMAL LOW (ref 26.0–34.0)
MCV: 74.7 fL — ABNORMAL LOW (ref 78.0–100.0)
RDW: 18.3 % — ABNORMAL HIGH (ref 11.5–15.5)
WBC: 8.1 10*3/uL (ref 4.0–10.5)

## 2013-05-08 LAB — TSH: TSH: 3.137 u[IU]/mL (ref 0.350–4.500)

## 2013-05-08 LAB — RETICULOCYTES
RBC.: 1.98 MIL/uL — ABNORMAL LOW (ref 3.87–5.11)
Retic Count, Absolute: 67.3 10*3/uL (ref 19.0–186.0)
Retic Ct Pct: 3.4 % — ABNORMAL HIGH (ref 0.4–3.1)

## 2013-05-08 LAB — VITAMIN B12: Vitamin B-12: 188 pg/mL — ABNORMAL LOW (ref 211–911)

## 2013-05-08 LAB — LACTATE DEHYDROGENASE: LDH: 103 U/L (ref 94–250)

## 2013-05-08 LAB — IRON AND TIBC: Iron: <10 ug/dL — ABNORMAL LOW (ref 42–135)

## 2013-05-08 LAB — FOLATE: Folate: >20 ng/mL

## 2013-05-08 NOTE — Progress Notes (Deleted)
Talked to lab, at  approximately 1200 per request of                                 .  Lab was able to draw values from blood that was drawn on 05/06/13  Prior to blood tranfusions so they would be more accurate.   They were unable to get a PT and INR.  It was decided to add those to tomorrow mornings lab.   At the time I was informed the blood was not ready to be transfused.

## 2013-05-08 NOTE — Consult Note (Signed)
[facility]  [phone]   HEMATOLOGY ONCOLOGY CONSULTATION   Shelby Gardner  DOB: 03-Apr-1976  MR#: [id number]  CSN#: [id number]    Requesting Physician:Lisa Tamela Oddi, MD  Primary MD: Andi Devon, MD  History of present illness:         37  year old African American female with a history of lower extremity DVT in August  2014, after pannicular reduction in  August  of 2014 (for Gastric Bypass in [place] of 2013 ) , on anti-coagulation with Xarelto, admitted on 05/06/2013 from [facility] ER, with one month history of vaginal bleeding complicated with dizziness on the day of admission.Of note, when she was initially diagnosed with R LE DVT she was placed on Lovenox and Coumadin transition, by apparently she was resistant to the medicine, for which se was then placed on Xarelto 20 mg/d since October 2014.    Labs on 12/9  demonstrated   a hemoglobin and hematocrit of 4.3 and 15.4 respectively, with a white count of 10.5. Her MCV was 74, and her platelet count was normal at 271,000. Differential showed ANC of 7.4, lymphocytes absolute of 2.1 and monocytes of 0.8, all normal values. Repeat CBC 4 hours later, showed  a drop in hemoglobin to 4, with a hematocrit of 13.9. Her MCV dropped to 70.2. Retic count was 67.3 Her peripheral blood smear was remarkable for basophilic stippling,but no  other abnormalities were seen.Urinalysis was remarkable for small amount of urine bilirubin, otherwise negative.due to the severe anemia, she was transfused 2 units of blood, with some improvement  in her counts.hemoglobin and 05/07/2013 rose to 6.8 with hematocrit of 20.9 ,  But she continued to pass a few large clots vaginally. Her Xarelto was placed on hold as of 05/07/2013. Her bleeding markedly decreased.she is receiving another 2 units of blood on the same day, as ordered by attending physician.She is also on megestrol to  control the bleeding.  No other blood  studies are available for review, such as  LDH haptoglobin or iron studies,PT/INR  etc .No family history of hematological disorders such as sickle cell or thalasemia. Patient had never been evaluated for anemia by a hematologist. Never received IV Iron, but was recently taking Iron as instructed by her PCP after her bypass. Never had a bone marrow biopsy. Never had a colonoscopy or EGD.  No pica    No heavy or irregular periods prior to these events.LMP was on 04/28/2013 and prior to that on 11/1.  No ASA or NSAIDs. Never had a transfusion in the past.  Denies risk factors for HIV or hepatitis. No hemoptysis or epistaxis.  No blood in urine or in stool.Smear has been ordered for review. She denies heavy amount of greens, We were kindly requested to see the patient with recommendations.    Past medical history:    History of R LE DVT August of 2014 Obesity   Iron deficiency anemia  Past surgical history:      Past Surgical History  Procedure Laterality Date  . Gastric bypass [facility])  July 2013         Pannicular Surgery (Dr Shon Hough)  8/2014_        Medications:   Prior to Admission:  Prescriptions prior to admission  Medication Sig Dispense Refill  . ferrous sulfate 325 (65 FE) MG tablet Take 325 mg by mouth daily with breakfast.      . Multiple Vitamins-Minerals (MULTIVITAMIN WITH MINERALS) tablet Take 1 tablet by mouth daily.      . Progesterone Micronized (PROGESTERONE PO) Take by mouth.      . Rivaroxaban (XARELTO PO) Take by mouth.      . Vitamin D, Ergocalciferol, (DRISDOL) 50000 UNITS CAPS capsule Take 50,000 Units by mouth every 7 (seven) days.      Marland Kitchen omeprazole (PRILOSEC) 20 MG capsule Take 20 mg by mouth daily as needed.       . ursodiol (ACTIGALL) 300 MG capsule Take 300 mg by mouth 2 (two) times daily.       . megestrol  40 mg Oral BID  . prenatal multivitamin  1 tablet Oral Q1200        Allergies: No Known Allergies  Family history:      Family History  Problem Relation Age of Onset  . Diabetes Mother   . Cancer, gastric Father   . Heart disease Father   One uncle had a history of clotting disorder.                             Social history: Separated.  No Children (never been pregnant) . Lives in [place].never smoked Denies ETOH. No recreational drug use. Unemployed.      Review of systems:  See HPI for significant positives.  Constitutional: Positive for weight loss after the gastricbypass. Goal is to lose 200 lbs Negative for fever,night sweats, or chills  Eyes: Negative for blurred vision and double vision.  Respiratory: Negative for cough or  hemoptysis Negative for shortness of breath.  Cardiovascular: Negative for chest pain. Negative for palpitations.  GI: No nausea, vomiting, diarrhea, or constipation. No change in bowel caliber. No  Melena or hematochezia. No abdominal pain.  NW:GNFAOZHY for hematuria. No loss of urinary control. No Urinary retention.  Skin: Negative for itching. No rash. No petechia. No bruising.  Neurological: No headaches. No motor or sensory deficits.No confusion.     Physical exam:       Filed Vitals:   05/08/13 0535  BP: 116/71  Pulse: 80  Temp: 98.3 F (36.8 C)  Resp: 16    Weight change:   General:  20 -year-old AAF   in no acute distress A. and O. x3  well-developed  HEENT: Normocephalic, atraumatic, PERRLA, sclerae anicteric. Oral cavity without thrush or lesions. Neck supple. no thyromegaly, no cervical or supraclavicular adenopathy  Lungs clear bilaterally . No wheezing, rhonchi or rales. No axillary masses. Breasts: not examined. Cardiac regular rate and rhythm, no murmur , rubs or gallops Abdomen soft nontender, obese, bowel sounds x4. No HSM. No masses palpable.Well healed surgical scars   GU/rectal: deferred. Extremities no clubbing, no  cyanosis or edema. No bruising or petechial  rash Musculoskeletal: no spinal tenderness.  Neuro: Non Focal   Lab results:      Recent Labs Lab 05/06/13 1835 05/06/13 2210 05/07/13 1603 05/08/13 0915  WBC 10.5 7.5 8.5 8.1  HGB 4.3* 4.0* 6.8* 6.7*  HCT 15.4* 13.9* 20.9* 20.7*  PLT 271 215 191 186  MCV 74.0* 70.2* 74.9* 74.7*  MCH 20.7* 20.2* 24.4* 24.2*  MCHC 27.9* 28.8* 32.5 32.4  RDW 18.7* 18.8* 17.8* 18.3*  LYMPHSABS 2.1  --   --   --   MONOABS 0.8  --   --   --   EOSABS 0.1  --   --   --   BASOSABS 0.1  --   --   --     Chemistries   Recent Labs Lab 05/06/13 1835  NA 139  K 3.6  CL 106  CO2 22  GLUCOSE 146*  BUN 11  CREATININE 0.60  CALCIUM 8.3*    Anemia panel:  No results found for this basename: VITAMINB12, FOLATE, FERRITIN, TIBC, IRON, RETICCTPCT,  in the last 72 hours  Coagulation profile No results found for this basename: INR, PROTIME,  in the last 168 hours  Urine Studies No results found for this basename: UACOL, UAPR, USPG, UPH, UTP, UGL, UKET, UBIL, UHGB, UNIT, UROB, ULEU, UEPI, UWBC, URBC, UBAC, CAST, CRYS, UCOM, BILUA,  in the last 72 hours  Studies:      No results found.  Assessmnent/Plan:37 y.o. female admitted on 05/06/2013 with a month history of vaginal bleeding, worse on the day of admission, while on Xarelto for recent history of DVT in 12/2012  following pannicular surgery  in August 2014  after a gastric bypass in July 2013. Will request records for further data. Patient was found to have severe anemia, requiring a total of 4 units of blood while hospitalized.Xarelto  is currently on hold, and the patient is on megestrol with much  improvement of counts.Of note, it is unclear if her decrease in bleeding is directly related to megestrol or part of it is resolution of her period  We were requested to see the patient with recommendations. Smear has been ordered for review. Check   LDH as well as anemia panel, in view of her low MCV. Will deferred further studies to Dr. Welton Flakes,  hematologist who is to see the patient following this consult with recommendations further workup studiesand recommendations regarding reinitiation of Xarelto versus any other options for the management of recent DVT.  An addendum to this note is to be written. Thank you for the referral.   Marcos Eke, PA-C 05/08/2013   ATTENDING'S ATTESTATION:  I personally reviewed patient's chart, examined patient myself, formulated the treatment plan as followed.    Patient with history of DVT secondary to pannicular surgery in August 2014 after a gastric bypass. Patient had been receiving initially Coumadin but according to the patient they were unable to regulate her INRs. Because of this she was placed on xeralto in October 2014. She had been doing well until her regular and normal menstruation cycle began. She tells me she continued to have severe bleeding vaginally to a point where her hemoglobin fell drastically. The xeralto was discontinued and patient was admitted to [facility] for further evaluation. She has been transfused 4 units of packed red cells her hemoglobin has come up slightly. She was also started on megestrol which did improve and reduce her bleeding. Patient is currently not on any anticoagulation. Per patient's recollection she was going to come off of anticoagulation in January. Patient has no prior history of hypercoagulability there is no family history of hypercoagulability. She has not bled from anywhere else other than vaginal bleeding.  At this time my recommendation would be to hold anticoagulation altogether. I would suggest getting a Doppler study of the lower extremity to see if her clot has resolved. The patient needs to be on anticoagulation I would suggest putting  her on Coumadin to try to monitor her INR closely. Also further workup for a possible intrauterine pathology as per primary team. I will go ahead and set up a Doppler study of patient's lower extremities and  we'll continue to follow with you.  Drue Second, MD Medical/Oncology [facility] [phone] (beeper) [phone] (Office)

## 2013-05-08 NOTE — Progress Notes (Signed)
Patient ID: Shelby Gardner, female   DOB: [birth year], 37 y.o.   MRN: [id number] Subjective: Interval History: bleeding markedly decreased  Objective: Vital signs in last 24 hours: Temp:  [98 F (36.7 C)-98.5 F (36.9 C)] 98.3 F (36.8 C) (12/11 0535) Pulse Rate:  [77-102] 80 (12/11 0535) Resp:  [16-18] 16 (12/11 0535) BP: (99-135)/(60-84) 116/71 mmHg (12/11 0535) SpO2:  [100 %] 100 % (12/11 0535)  Intake/Output from previous day: 12/10 0701 - 12/11 0700 In: 855 [I.V.:500] Out: 1000 [Urine:1000]     Abd: NT PV loss: scant heme  Results for orders placed during the hospital encounter of 05/06/13 (from the past 24 hour(s))  CBC     Status: Abnormal   Collection Time    05/07/13  4:03 PM      Result Value Range   WBC 8.5  4.0 - 10.5 K/uL   RBC 2.79 (*) 3.87 - 5.11 MIL/uL   Hemoglobin 6.8 (*) 12.0 - 15.0 g/dL   HCT 40.9 (*) 81.1 - 91.4 %   MCV 74.9 (*) 78.0 - 100.0 fL   MCH 24.4 (*) 26.0 - 34.0 pg   MCHC 32.5  30.0 - 36.0 g/dL   RDW 78.2 (*) 95.6 - 21.3 %   Platelets 191  150 - 400 K/uL    Studies/Results: No results found.  Scheduled Meds: . megestrol  40 mg Oral BID  . prenatal multivitamin  1 tablet Oral Q1200   Continuous Infusions:  PRN Meds:  Assessment/Plan: Active Problems:   Anemia   Abnormal uterine bleeding (AUB) Acute bleed in setting of anticoagulation, h/o gastric bypass--?contribution of malabsorption of fat soluble vitamins/vitamin K; hemodynamically stable Bleeding currently controlled with Megace  -->Hematology consult -->Check U/S/TSH -->transfuse additional 2 units PRBC   LOS: 2 days   JACKSON-MOORE,Dajsha Massaro A

## 2013-05-08 NOTE — Progress Notes (Signed)
Talked to lab, at approximately 1200 per request of Marlowe Kays PA.  (([facility]) . Lab was able to draw values from blood that was drawn on 05/06/13 Prior to blood tranfusions so they would be more accurate. They were unable to get a PT and INR. It was decided to add those to tomorrow mornings lab. At the time I was informed the blood was not ready to be transfused.

## 2013-05-09 DIAGNOSIS — Z86718 Personal history of other venous thrombosis and embolism: Secondary | ICD-10-CM

## 2013-05-09 DIAGNOSIS — D259 Leiomyoma of uterus, unspecified: Secondary | ICD-10-CM | POA: Diagnosis present

## 2013-05-09 LAB — CBC
MCHC: 33.6 g/dL (ref 30.0–36.0)
MCV: 76.5 fL — ABNORMAL LOW (ref 78.0–100.0)
Platelets: 187 10*3/uL (ref 150–400)
RDW: 18.6 % — ABNORMAL HIGH (ref 11.5–15.5)
WBC: 8.3 10*3/uL (ref 4.0–10.5)

## 2013-05-09 LAB — PROTIME-INR: INR: 1.14 (ref 0.00–1.49)

## 2013-05-09 MED ORDER — INFLUENZA VAC SPLIT QUAD 0.5 ML IM SUSP: 0.5000 mL | INTRAMUSCULAR | Status: DC

## 2013-05-09 MED ORDER — INFLUENZA VAC SPLIT QUAD 0.5 ML IM SUSP
0.5000 mL | INTRAMUSCULAR | Status: AC | PRN
  Administered 2013-05-09: 0.5 mL via INTRAMUSCULAR
  Filled 2013-05-09: qty 0.5

## 2013-05-09 MED ORDER — WARFARIN SODIUM 5 MG PO TABS
5.0000 mg | ORAL_TABLET | Freq: Every day | ORAL | Status: DC
  Administered 2013-05-09: 5 mg via ORAL
  Filled 2013-05-09: qty 1

## 2013-05-09 MED ORDER — WARFARIN SODIUM 5 MG PO TABS: 5.0000 mg | ORAL_TABLET | Freq: Every day | ORAL | Status: DC

## 2013-05-09 MED ORDER — WARFARIN - PHARMACIST DOSING INPATIENT: Freq: Every day | Status: DC

## 2013-05-09 MED ORDER — ENOXAPARIN SODIUM 150 MG/ML ~~LOC~~ SOLN
1.0000 mg/kg | Freq: Two times a day (BID) | SUBCUTANEOUS | Status: DC
  Administered 2013-05-09: 130 mg via SUBCUTANEOUS
  Filled 2013-05-09 (×2): qty 1

## 2013-05-09 MED ORDER — MEGESTROL ACETATE 40 MG PO TABS: 40.0000 mg | ORAL_TABLET | Freq: Two times a day (BID) | ORAL | Status: DC

## 2013-05-09 MED ORDER — ENOXAPARIN SODIUM 150 MG/ML ~~LOC~~ SOLN: 1.0000 mg/kg | Freq: Two times a day (BID) | SUBCUTANEOUS | Status: DC

## 2013-05-09 MED ORDER — LEUPROLIDE ACETATE (3 MONTH) 11.25 MG IM KIT
11.2500 mg | PACK | Freq: Once | INTRAMUSCULAR | Status: AC
  Administered 2013-05-09: 11.25 mg via INTRAMUSCULAR
  Filled 2013-05-09: qty 11.25

## 2013-05-09 NOTE — Progress Notes (Signed)
Dr.Jackson-Moore notified of patient stating she "can't afford to buy the Lovenox once I go home". Case management, Camelia Eng, went to see patient and gave her some coupons. Dr.Jackson-Moore going to call in patient's room and discuss options of taking Coumadin without Lovenox based on financial situation. Patient aware to expect phone call.

## 2013-05-09 NOTE — Discharge Summary (Signed)
Physician Discharge Summary  Patient ID: Shelby Gardner MRN: [id number] DOB/AGE: 39-Jul-1977 39 y.o.  Admit date: 05/06/2013 Discharge date: 05/09/2013  Admission Diagnoses: Abnormal uterine bleeding (AUB)  Discharge Diagnoses:  Principal Problem:   Abnormal uterine bleeding (AUB) Active Problems:   Anemia   Leiomyoma of uterus, unspecified   Discharged Condition: stable  Hospital Course: The patient presented with an acute vaginal hemorrhage and orthostatic symptoms.  She was profoundly anemic and was transfused a total of 6 units of PRBC.  A hemoglobin at the time of discharge was 7.9.  She was started on Megace to arrest the bleeding episode.  On the day of discharge the vaginal bleeding was scant.  A pelvic U/S showed two small uterine fibroids.  Hematology was consulted.  Duplex dopplers of the lower extremities were negative.  The plan at discharge was bridging to coumadin with Lovenox.  She also received a dose of a GnRH agonist.  Consults: hematology/oncology  Significant Diagnostic Studies: see above  Treatments: see above  Discharge Exam: Blood pressure 99/55, pulse 89, temperature 98 F (36.7 C), temperature source Oral, resp. rate 20, height 5\' 4"  (1.626 m), weight 129.729 kg (286 lb), last menstrual period 03/29/2013, SpO2 100.00%. General appearance: alert GI: soft, non-tender; bowel sounds normal; no masses,  no organomegaly PV loss: scant  Disposition: 01-Home or Self Care  Discharge Orders   Future Appointments Provider Department Dept Phone   05/12/2013 11:15 AM Antionette Char, MD [facility] [phone]   Future Orders Complete By Expires   Call MD for:  extreme fatigue  As directed    Call MD for:  persistant dizziness or light-headedness  As directed    Diet general  As directed    Increase activity slowly  As directed    May shower / Bathe  As directed    May walk up steps  As directed        Medication List    STOP taking  these medications       PROGESTERONE PO     XARELTO PO      TAKE these medications       enoxaparin 150 MG/ML injection  Commonly known as:  LOVENOX  Inject 0.86 mLs (130 mg total) into the skin every 12 (twelve) hours.     ferrous sulfate 325 (65 FE) MG tablet  Take 325 mg by mouth daily with breakfast.     megestrol 40 MG tablet  Commonly known as:  MEGACE  Take 1 tablet (40 mg total) by mouth 2 (two) times daily.     omeprazole 20 MG capsule  Commonly known as:  PRILOSEC  Take 20 mg by mouth daily as needed.     ursodiol 300 MG capsule  Commonly known as:  ACTIGALL  Take 300 mg by mouth 2 (two) times daily.     Vitamin D (Ergocalciferol) 50000 UNITS Caps capsule  Commonly known as:  DRISDOL  Take 50,000 Units by mouth every 7 (seven) days.     warfarin 5 MG tablet  Commonly known as:  COUMADIN  Take 1 tablet (5 mg total) by mouth daily at 6 PM.      ASK your doctor about these medications       multivitamin with minerals tablet  Take 1 tablet by mouth daily.           Follow-up Information   Follow up with Roseanna Rainbow, MD. (keep previous appoinment)    Specialty:  Obstetrics and Gynecology  Contact information:   [address] [place] [address] [phone]       Follow up with Alva Garnet., MD On 05/14/2013. (@8 :45)    Specialty:  Internal Medicine   Contact information:   [address] [place] [address] [phone]       Signed: Roseanna Rainbow 05/09/2013, 5:18 PM

## 2013-05-09 NOTE — Progress Notes (Signed)
ANTICOAGULATION CONSULT NOTE - Initial Consult  Pharmacy Consult for Lovenox/coumadin Indication: H/O DVT in August 2014  No Known Allergies  Patient Measurements: Height: 5\' 4"  (162.6 cm) Weight: 286 lb (129.729 kg) IBW/kg (Calculated) : 54.7   Vital Signs: Temp: 98 F (36.7 C) (12/12 1151) Temp src: Oral (12/12 1151) BP: 99/55 mmHg (12/12 1151) Pulse Rate: 89 (12/12 1151)  Labs:  Recent Labs  05/06/13 1835  05/07/13 1603 05/08/13 0915 05/09/13 0530  HGB 4.3*  < > 6.8* 6.7* 7.9*  HCT 15.4*  < > 20.9* 20.7* 23.5*  PLT 271  < > 191 186 187  LABPROT  --   --   --   --  14.4  INR  --   --   --   --  1.14  CREATININE 0.60  --   --   --   --   < > = values in this interval not displayed.  Estimated Creatinine Clearance: 126.2 ml/min (by C-G formula based on Cr of 0.6).   Medical History: History reviewed. No pertinent past medical history.  Medications:  Megace 40 mg BID Lurpron 11.25 mg every 3 months  Assessment:  Shelby Gardner is an 37 y.o. female who presented to [facility] with H/O vaginal bleeding since 03-29-13, now weak and dizzy. 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 She was on Xarelto prior to this admission and her Hg on 05/06/13 was 4.  Pt. Reports being on coumadin with doses varying b/w 2.5mg  and 7.5mg  daily.  She reports difficulty in getting the dose properly adjusted from August to the end of October which necessitated the switch to Xarelto.  She is hestitate to start lovenox bridge, but once options discussed, she has agreed and understands the necessity of lovenox.    Goal of Therapy:  INR 2-3.   Plan:  Lovenox 1 mg/kg (130mg ) SQ every 12 hours until 5 days of  therapeutic overlap with coumadin.  Patient to get coumadin 5 mg po daily and will follow up with her primary care doctor early next week.  Her OB/GYN is making an appointment for her and plans to d/c this patient home tonight.  Patient has had  coumadin/lovenox teaching and voices understanding.    Berlin Hun D 05/09/2013,2:54 PM

## 2013-05-09 NOTE — Care Management Note (Unsigned)
    Page 1 of 1   05/09/2013     3:59:04 PM   CARE MANAGEMENT NOTE 05/09/2013  Patient:  Shelby Gardner, Shelby Gardner   Account Number:  [id number]  Date Initiated:  05/09/2013  Documentation initiated by:  CRAFT,TERRI  Subjective/Objective Assessment:   37 year old female admitted 05/06/13 with bleeding and dizziness     Action/Plan:   D/C when medically stable   Anticipated DC Date:  05/12/2013         DC Planning Services  CM consult                Status of service:  Completed, signed off  Per UR Regulation:  Reviewed for med. necessity/level of care/duration of stay  Comments:  05/09/13, Kathi Der RNC-MNN, BSN, [phone], CM received referral from pharmacist reference medication assistance for pt.  Pt has primary insurance and therefore is not a candidate for medication assistance/MATCH.  CM met with pt and given coupons as well as prescription savings cards. Pt is able to get medication free through mail order and has to pay co pay otherwise.  Discussed discount pharmacies for prescriptions.

## 2013-05-09 NOTE — Progress Notes (Signed)
*  PRELIMINARY RESULTS* Vascular Ultrasound Lower extremity venous duplex has been completed.  Preliminary findings: no evidence of DVT bilaterally  Farrel Demark, RDMS, RVT  05/09/2013, 9:37 AM

## 2013-05-09 NOTE — Progress Notes (Signed)
Subjective: Patient reports scant vaginal bleeding.  Objective: I have reviewed patient's vital signs, intake and output, medications, labs and radiology results.  General: alert and no distress Resp: clear to auscultation bilaterally Cardio: regular rate and rhythm GI: normal findings: soft, non-tender Extremities: extremities normal, atraumatic, no cyanosis or edema Vagina:  Scant blood on pad.   Assessment/Plan: AUB on anticoagulation with Xarelto.  Stable after transfusion of PRBC's.  Possible discharge home today.  LOS: 3 days    Shelby Gardner A 05/09/2013, 11:34 AM

## 2013-05-10 LAB — TYPE AND SCREEN
ABO/RH(D): A POS
Antibody Screen: NEGATIVE
Unit division: 0
Unit division: 0
Unit division: 0
Unit division: 0
Unit division: 0

## 2013-05-12 ENCOUNTER — Encounter: Payer: Self-pay | Admitting: Obstetrics & Gynecology

## 2013-05-12 ENCOUNTER — Ambulatory Visit (INDEPENDENT_AMBULATORY_CARE_PROVIDER_SITE_OTHER): Admitting: Obstetrics & Gynecology

## 2013-05-12 VITALS — BP 117/84 | HR 87 | Temp 98.1°F | Ht 63.0 in | Wt 290.0 lb

## 2013-05-12 DIAGNOSIS — N926 Irregular menstruation, unspecified: Secondary | ICD-10-CM

## 2013-05-12 DIAGNOSIS — Z0000 Encounter for general adult medical examination without abnormal findings: Secondary | ICD-10-CM

## 2013-05-12 LAB — POCT URINALYSIS DIPSTICK
Leukocytes, UA: NEGATIVE
Nitrite, UA: NEGATIVE
Urobilinogen, UA: NEGATIVE
pH, UA: 5

## 2013-05-12 NOTE — Progress Notes (Signed)
Subjective:     Shelby Gardner is a 37 y.o. female here for a routine exam.  Current complaints: heavy prolonged heavy menses.  Personal health questionnaire reviewed: yes.   Gynecologic History Patient's last menstrual period was 03/29/2013. Contraception: none Last Pap: 08/2012. Results were: normal Last mammogram: N/A  Obstetric History OB History  No data available     The following portions of the patient's history were reviewed and updated as appropriate: allergies, current medications, past family history, past medical history, past social history, past surgical history and problem list.  Review of Systems Pertinent items are noted in HPI.    Objective:     No exam today     Assessment:   AUB--C, L; s/p hospitalization for an acute bleed No bleeding at present  Plan:   Continue Megace; received a GnRH agonist in the hospital F/U in a few weeks

## 2013-05-15 LAB — FERRITIN: Ferritin: 1 ng/mL — ABNORMAL LOW (ref 10–291)

## 2013-05-26 ENCOUNTER — Encounter: Payer: Self-pay | Admitting: Obstetrics & Gynecology

## 2013-05-26 ENCOUNTER — Ambulatory Visit: Admitting: Obstetrics & Gynecology

## 2013-05-26 ENCOUNTER — Ambulatory Visit (INDEPENDENT_AMBULATORY_CARE_PROVIDER_SITE_OTHER): Admitting: Obstetrics & Gynecology

## 2013-05-26 VITALS — BP 120/83 | HR 71 | Temp 98.4°F | Wt 282.0 lb

## 2013-05-26 DIAGNOSIS — D259 Leiomyoma of uterus, unspecified: Secondary | ICD-10-CM

## 2013-05-26 DIAGNOSIS — D649 Anemia, unspecified: Secondary | ICD-10-CM

## 2013-05-26 DIAGNOSIS — N926 Irregular menstruation, unspecified: Secondary | ICD-10-CM

## 2013-05-26 DIAGNOSIS — N939 Abnormal uterine and vaginal bleeding, unspecified: Secondary | ICD-10-CM

## 2013-05-26 NOTE — Progress Notes (Signed)
Subjective:     Shelby Gardner is a 37 y.o. female here for a follow up exam.  Current complaints: pt is in office for follow up due to prolong cycle.  Pt was in 2 weeks ago and has had improvement since.  Pt states that her cycle has since stopped. Next cycle should start in January.  Personal health questionnaire reviewed: no.   Gynecologic History Patient's last menstrual period was 03/29/2013.   Obstetric History OB History  No data available     The following portions of the patient's history were reviewed and updated as appropriate: allergies, current medications, past family history, past medical history, past social history, past surgical history and problem list.  Review of Systems Pertinent items are noted in HPI.    Objective:     No exam today     Assessment:   AUB on anticoagulant therapy--no bleeding at present  Plan:    Continue Megace Return prn or in 1 mth

## 2013-06-26 ENCOUNTER — Ambulatory Visit: Admitting: Obstetrics & Gynecology

## 2013-06-27 ENCOUNTER — Ambulatory Visit (INDEPENDENT_AMBULATORY_CARE_PROVIDER_SITE_OTHER): Admitting: Obstetrics & Gynecology

## 2013-06-27 ENCOUNTER — Encounter: Payer: Self-pay | Admitting: Obstetrics & Gynecology

## 2013-06-27 VITALS — BP 105/71 | HR 90 | Temp 98.1°F | Ht 63.0 in | Wt 287.0 lb

## 2013-06-27 DIAGNOSIS — Z3141 Encounter for fertility testing: Secondary | ICD-10-CM

## 2013-06-27 MED ORDER — MEGESTROL ACETATE 40 MG PO TABS: 40.0000 mg | ORAL_TABLET | Freq: Two times a day (BID) | ORAL | Status: DC

## 2013-06-27 NOTE — Progress Notes (Signed)
Subjective:     Shelby Gardner is a 38 y.o. female here for a routine exam.  Current complaints:Paient is in office today for a follow up visit regarding blood clots. Patient states that bleeding stopped. Patient states that she just has spotting off and on. Patient states she is no longer on blood thinners. Patient states she would like to know what happens from here, does she continue with the medicine or stop taking it.  Personal health questionnaire reviewed: yes.   Gynecologic History Patient's last menstrual period was 03/26/2013. Contraception: none Last Pap: 09/23/2012. Results were: normal  Obstetric History OB History  Gravida Para Term Preterm AB SAB TAB Ectopic Multiple Living  0 0 0 0 0 0 0 0 0 0          The following portions of the patient's history were reviewed and updated as appropriate: allergies, current medications, past family history, past medical history, past social history, past surgical history and problem list.  Review of Systems Pertinent items are noted in HPI.    Objective:    No exam today   Assessment:   AUB/episode of prolonged/heavy bleeding in setting of anticoagulation therapy--anticoagulation therapy discontinued  Considering fertility potential/options Plan:   Continue Megace for now Orders Placed This Encounter  Procedures  . Anti mullerian hormone  Return in a few months

## 2013-06-30 LAB — ANTI MULLERIAN HORMONE: AMH AssessR: <0.03 ng/mL

## 2013-07-07 ENCOUNTER — Encounter: Payer: Self-pay | Admitting: Obstetrics & Gynecology

## 2013-07-07 DIAGNOSIS — IMO0001 Reserved for inherently not codable concepts without codable children: Secondary | ICD-10-CM

## 2013-07-23 MED ORDER — NORETHINDRONE 0.35 MG PO TABS: 1.0000 | ORAL_TABLET | Freq: Every day | ORAL | Status: DC

## 2013-07-23 NOTE — Telephone Encounter (Signed)
Rx sent to pharmacy   

## 2013-07-31 ENCOUNTER — Encounter: Payer: Self-pay | Admitting: Obstetrics & Gynecology

## 2013-09-23 ENCOUNTER — Other Ambulatory Visit (HOSPITAL_COMMUNITY): Payer: Self-pay | Admitting: Obstetrics & Gynecology

## 2013-09-24 NOTE — Telephone Encounter (Signed)
Please advise 

## 2013-09-25 ENCOUNTER — Ambulatory Visit: Admitting: Obstetrics & Gynecology

## 2013-09-25 NOTE — Telephone Encounter (Signed)
Don't need to refill if pt no longer on anticoagulant therapy

## 2013-09-29 NOTE — Telephone Encounter (Signed)
Please review

## 2013-10-24 DIAGNOSIS — T819XXA Unspecified complication of procedure, initial encounter: Secondary | ICD-10-CM | POA: Insufficient documentation

## 2014-04-06 DIAGNOSIS — Z9889 Other specified postprocedural states: Secondary | ICD-10-CM | POA: Insufficient documentation

## 2014-05-25 ENCOUNTER — Encounter: Payer: Self-pay | Admitting: *Deleted

## 2014-05-26 ENCOUNTER — Encounter: Payer: Self-pay | Admitting: Obstetrics & Gynecology

## 2014-07-29 ENCOUNTER — Other Ambulatory Visit: Payer: Self-pay | Admitting: *Deleted

## 2014-07-29 ENCOUNTER — Telehealth: Payer: Self-pay | Admitting: Obstetrics

## 2014-07-29 DIAGNOSIS — Z3041 Encounter for surveillance of contraceptive pills: Secondary | ICD-10-CM

## 2014-07-29 MED ORDER — NORETHINDRONE 0.35 MG PO TABS: 1.0000 | ORAL_TABLET | Freq: Every day | ORAL | Status: DC

## 2014-08-05 NOTE — Telephone Encounter (Signed)
[id number] - Patient returned call and schedules AEX. brm

## 2014-08-06 ENCOUNTER — Encounter: Payer: Self-pay | Admitting: Certified Nurse Midwife

## 2014-08-06 ENCOUNTER — Ambulatory Visit (INDEPENDENT_AMBULATORY_CARE_PROVIDER_SITE_OTHER): Admitting: Certified Nurse Midwife

## 2014-08-06 VITALS — BP 107/80 | HR 70 | Temp 97.3°F | Ht 63.0 in | Wt 293.0 lb

## 2014-08-06 DIAGNOSIS — Z01419 Encounter for gynecological examination (general) (routine) without abnormal findings: Secondary | ICD-10-CM

## 2014-08-06 DIAGNOSIS — Z113 Encounter for screening for infections with a predominantly sexual mode of transmission: Secondary | ICD-10-CM

## 2014-08-06 NOTE — Progress Notes (Signed)
Patient ID: Shelby Gardner, female   DOB: August 04, 1975, 39 y.o.   MRN: [id number]   Subjective:     Shelby Gardner is a 39 y.o. female here for a routine exam.  Current complaints: none.  Has a long significant hx of obesity with gastric bypass in 2013, fat removal surgeries, and DVT that has resolved.  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  Desires to continue birth control.  Not currently sexually active, she is currently separated. Had a past hx of infertility.        Personal health questionnaire:  Is patient Ashkenazi Jewish, have a family history of breast and/or ovarian cancer: no Is there a family history of uterine cancer diagnosed at age < 30, gastrointestinal cancer, urinary tract cancer, family member who is a Field seismologist syndrome-associated carrier: no Is the patient overweight and hypertensive, family history of diabetes, personal history of gestational diabetes, preeclampsia or PCOS: yes Is patient over 81, have PCOS,  family history of premature CHD under age 23, diabetes, smoke, have hypertension or peripheral artery disease:  no At any time, has a partner hit, kicked or otherwise hurt or frightened you?: no Over the past 2 weeks, have you felt down, depressed or hopeless?: no Over the past 2 weeks, have you felt little interest or pleasure in doing things?:no   Gynecologic History Patient's last menstrual period was 07/07/2014. Contraception: oral progesterone-only contraceptive Last Pap: 09/23/12. Results were: normal Last mammogram: none.  Obstetric History OB History  Gravida Para Term Preterm AB SAB TAB Ectopic Multiple Living  0 0 0 0 0 0 0 0 0 0         Past Medical History  Diagnosis Date  . Heavy menses   . DVT (deep venous thrombosis)   . Anemia     due to blood loss    Past Surgical History  Procedure Laterality Date  . Gastric bypass  July 2013  . Excess skin removal       Current outpatient prescriptions:   .  calcium-vitamin D (OSCAL WITH D) 500-200 MG-UNIT per tablet, Take 1 tablet by mouth., Disp: , Rfl:  .  Cyanocobalamin (VITAMIN B-12) 1000 MCG SUBL, Place 1 tablet under the tongue., Disp: , Rfl:  .  ferrous sulfate 325 (65 FE) MG tablet, Take 325 mg by mouth daily with breakfast., Disp: , Rfl:  .  Multiple Vitamins-Minerals (MULTIVITAMIN WITH MINERALS) tablet, Take 1 tablet by mouth daily., Disp: , Rfl:  .  norethindrone (MICRONOR,CAMILA,ERRIN) 0.35 MG tablet, Take 1 tablet (0.35 mg total) by mouth daily., Disp: 1 Package, Rfl: 11 .  Vitamin D, Ergocalciferol, (DRISDOL) 50000 UNITS CAPS capsule, Take 50,000 Units by mouth every 7 (seven) days., Disp: , Rfl:  .  ferrous sulfate 325 (65 FE) MG tablet, Take 325 mg by mouth., Disp: , Rfl:  .  Multiple Vitamin (MULTI-VITAMINS) TABS, Take 1 tablet by mouth., Disp: , Rfl:  .  omeprazole (PRILOSEC) 20 MG capsule, Take 20 mg by mouth daily as needed. , Disp: , Rfl:  .  UNKNOWN TO PATIENT, Birth Control 1 tablet by mouth daily, Disp: , Rfl:  No Known Allergies  History  Substance Use Topics  . Smoking status: Never Smoker   . Smokeless tobacco: Never Used  . Alcohol Use: No    Family History  Problem Relation Age of Onset  . Diabetes Mother   . Cancer Father   . Heart disease Father       Review of  Systems  Constitutional: negative for fatigue and weight loss Respiratory: negative for cough and wheezing Cardiovascular: negative for chest pain, fatigue and palpitations Gastrointestinal: negative for abdominal pain and change in bowel habits Musculoskeletal:negative for myalgias Neurological: negative for gait problems and tremors Behavioral/Psych: negative for abusive relationship, depression Endocrine: negative for temperature intolerance   Genitourinary:negative for abnormal menstrual periods, genital lesions, hot flashes, sexual problems and vaginal discharge Integument/breast: negative for breast lump, breast tenderness, nipple  discharge and skin lesion(s)    Objective:       BP 107/80 mmHg  Pulse 70  Temp(Src) 97.3 F (36.3 C)  Ht 5\' 3"  (1.6 m)  Wt 132.904 kg (293 lb)  BMI 51.92 kg/m2  LMP 07/07/2014 General:   alert  Skin:   no rash or abnormalities  Lungs:   clear to auscultation bilaterally  Heart:   regular rate and rhythm, S1, S2 normal, no murmur, click, rub or gallop  Breasts:   normal without suspicious masses, skin or nipple changes or axillary nodes  Abdomen:  normal findings: no organomegaly, soft, non-tender and no hernia  Pelvis:  External genitalia: normal general appearance Urinary system: urethral meatus normal and bladder without fullness, nontender Vaginal: normal without tenderness, induration or masses Cervix: normal appearance Adnexa: normal bimanual exam Uterus: anteverted and non-tender, normal size   Lab Review Urine pregnancy test Labs reviewed yes Radiologic studies reviewed no   Assessment:    Healthy female exam.   Morbid obesity   Plan:    Education reviewed: low fat, low cholesterol diet, safe sex/STD prevention, self breast exams and skin cancer screening. Contraception: oral progesterone-only contraceptive. Mammogram ordered. Follow up in: 1 year.   Meds ordered this encounter  Medications  . calcium-vitamin D (OSCAL WITH D) 500-200 MG-UNIT per tablet    Sig: Take 1 tablet by mouth.  . Cyanocobalamin (VITAMIN B-12) 1000 MCG SUBL    Sig: Place 1 tablet under the tongue.  . Multiple Vitamin (MULTI-VITAMINS) TABS    Sig: Take 1 tablet by mouth.  . ferrous sulfate 325 (65 FE) MG tablet    Sig: Take 325 mg by mouth.  Marland Kitchen UNKNOWN TO PATIENT    Sig: Birth Control 1 tablet by mouth daily   Orders Placed This Encounter  Procedures  . SureSwab, Vaginosis/Vaginitis Plus  . MM DIGITAL SCREENING BILATERAL    Standing Status: Future     Number of Occurrences:      Standing Expiration Date: 10/06/2015    Order Specific Question:  Reason for Exam (SYMPTOM  OR  DIAGNOSIS REQUIRED)    Answer:  annual screening    Order Specific Question:  Is the patient pregnant?    Answer:  No    Order Specific Question:  Preferred imaging location?    Answer:  [facility]  . HIV antibody (with reflex)  . Hepatitis B surface antigen  . RPR  . Hepatitis C antibody

## 2014-08-07 ENCOUNTER — Telehealth: Payer: Self-pay

## 2014-08-07 LAB — HIV ANTIBODY (ROUTINE TESTING W REFLEX): HIV 1&2 Ab, 4th Generation: NONREACTIVE

## 2014-08-07 LAB — HEPATITIS B SURFACE ANTIGEN: HEP B S AG: NEGATIVE

## 2014-08-07 LAB — RPR

## 2014-08-07 LAB — HEPATITIS C ANTIBODY: HCV Ab: NEGATIVE

## 2014-08-07 NOTE — Telephone Encounter (Signed)
left message for patient with 08/14/14 11:30am appt at [facility] for mammogram

## 2014-08-10 LAB — PAP IG AND HPV HIGH-RISK: HPV DNA High Risk: DETECTED — AB

## 2014-08-10 LAB — SURESWAB, VAGINOSIS/VAGINITIS PLUS
Atopobium vaginae: NOT DETECTED Log (cells/mL)
BV CATEGORY: UNDETERMINED — AB
C. TROPICALIS, DNA: NOT DETECTED
C. albicans, DNA: DETECTED — AB
C. glabrata, DNA: NOT DETECTED
C. parapsilosis, DNA: NOT DETECTED
C. trachomatis RNA, TMA: NOT DETECTED
GARDNERELLA VAGINALIS: 7 Log (cells/mL)
LACTOBACILLUS SPECIES: 6.4 Log (cells/mL)
MEGASPHAERA SPECIES: NOT DETECTED Log (cells/mL)
N. gonorrhoeae RNA, TMA: NOT DETECTED
T. VAGINALIS RNA, QL TMA: NOT DETECTED

## 2014-08-11 ENCOUNTER — Other Ambulatory Visit: Payer: Self-pay | Admitting: Certified Nurse Midwife

## 2014-08-11 ENCOUNTER — Other Ambulatory Visit: Payer: Self-pay | Admitting: *Deleted

## 2014-08-11 DIAGNOSIS — B9689 Other specified bacterial agents as the cause of diseases classified elsewhere: Secondary | ICD-10-CM | POA: Insufficient documentation

## 2014-08-11 DIAGNOSIS — B373 Candidiasis of vulva and vagina: Secondary | ICD-10-CM

## 2014-08-11 DIAGNOSIS — Z01812 Encounter for preprocedural laboratory examination: Secondary | ICD-10-CM

## 2014-08-11 DIAGNOSIS — N760 Acute vaginitis: Principal | ICD-10-CM

## 2014-08-11 DIAGNOSIS — B3731 Acute candidiasis of vulva and vagina: Secondary | ICD-10-CM | POA: Insufficient documentation

## 2014-08-11 MED ORDER — HYDROCODONE-ACETAMINOPHEN 7.5-325 MG PO TABS
ORAL_TABLET | ORAL | Status: DC
Start: 2014-08-11 — End: 2018-08-01

## 2014-08-11 MED ORDER — METRONIDAZOLE 0.75 % VA GEL: 1.0000 | Freq: Two times a day (BID) | VAGINAL | Status: AC

## 2014-08-11 MED ORDER — FLUCONAZOLE 100 MG PO TABS: 100.0000 mg | ORAL_TABLET | Freq: Once | ORAL | Status: DC

## 2014-08-11 MED ORDER — METRONIDAZOLE 0.75 % VA GEL: 1.0000 | Freq: Two times a day (BID) | VAGINAL | Status: DC

## 2014-08-11 NOTE — Patient Instructions (Signed)
Bacterial Vaginosis Bacterial vaginosis is a vaginal infection that occurs when the normal balance of bacteria in the vagina is disrupted. It results from an overgrowth of certain bacteria. This is the most common vaginal infection in women of childbearing age. Treatment is important to prevent complications, especially in pregnant women, as it can cause a premature delivery. CAUSES  Bacterial vaginosis is caused by an increase in harmful bacteria that are normally present in smaller amounts in the vagina. Several different kinds of bacteria can cause bacterial vaginosis. However, the reason that the condition develops is not fully understood. RISK FACTORS Certain activities or behaviors can put you at an increased risk of developing bacterial vaginosis, including:  Having a new sex partner or multiple sex partners.  Douching.  Using an intrauterine device (IUD) for contraception. Women do not get bacterial vaginosis from toilet seats, bedding, swimming pools, or contact with objects around them. SIGNS AND SYMPTOMS  Some women with bacterial vaginosis have no signs or symptoms. Common symptoms include:  Grey vaginal discharge.  A fishlike odor with discharge, especially after sexual intercourse.  Itching or burning of the vagina and vulva.  Burning or pain with urination. DIAGNOSIS  Your health care provider will take a medical history and examine the vagina for signs of bacterial vaginosis. A sample of vaginal fluid may be taken. Your health care provider will look at this sample under a microscope to check for bacteria and abnormal cells. A vaginal pH test may also be done.  TREATMENT  Bacterial vaginosis may be treated with antibiotic medicines. These may be given in the form of a pill or a vaginal cream. A second round of antibiotics may be prescribed if the condition comes back after treatment.  HOME CARE INSTRUCTIONS   Only take over-the-counter or prescription medicines as  directed by your health care provider.  If antibiotic medicine was prescribed, take it as directed. Make sure you finish it even if you start to feel better.  Do not have sex until treatment is completed.  Tell all sexual partners that you have a vaginal infection. They should see their health care provider and be treated if they have problems, such as a mild rash or itching.  Practice safe sex by using condoms and only having one sex partner. SEEK MEDICAL CARE IF:   Your symptoms are not improving after 3 days of treatment.  You have increased discharge or pain.  You have a fever. MAKE SURE YOU:   Understand these instructions.  Will watch your condition.  Will get help right away if you are not doing well or get worse. FOR MORE INFORMATION  Centers for Disease Control and Prevention, Division of STD Prevention: AppraiserFraud.fi American Sexual Health Association (ASHA): www.ashastd.org  Document Released: 05/15/2005 Document Revised: 03/05/2013 Document Reviewed: 12/25/2012 [facility] Patient Information 2015 [place], [place]. This information is not intended to replace advice given to you by your health care provider. Make sure you discuss any questions you have with your health care provider. Bacterial Vaginosis Bacterial vaginosis is a vaginal infection that occurs when the normal balance of bacteria in the vagina is disrupted. It results from an overgrowth of certain bacteria. This is the most common vaginal infection in women of childbearing age. Treatment is important to prevent complications, especially in pregnant women, as it can cause a premature delivery. CAUSES  Bacterial vaginosis is caused by an increase in harmful bacteria that are normally present in smaller amounts in the vagina. Several different kinds of bacteria  can cause bacterial vaginosis. However, the reason that the condition develops is not fully understood. RISK FACTORS Certain activities or behaviors can put  you at an increased risk of developing bacterial vaginosis, including:  Having a new sex partner or multiple sex partners.  Douching.  Using an intrauterine device (IUD) for contraception. Women do not get bacterial vaginosis from toilet seats, bedding, swimming pools, or contact with objects around them. SIGNS AND SYMPTOMS  Some women with bacterial vaginosis have no signs or symptoms. Common symptoms include:  Grey vaginal discharge.  A fishlike odor with discharge, especially after sexual intercourse.  Itching or burning of the vagina and vulva.  Burning or pain with urination. DIAGNOSIS  Your health care provider will take a medical history and examine the vagina for signs of bacterial vaginosis. A sample of vaginal fluid may be taken. Your health care provider will look at this sample under a microscope to check for bacteria and abnormal cells. A vaginal pH test may also be done.  TREATMENT  Bacterial vaginosis may be treated with antibiotic medicines. These may be given in the form of a pill or a vaginal cream. A second round of antibiotics may be prescribed if the condition comes back after treatment.  HOME CARE INSTRUCTIONS   Only take over-the-counter or prescription medicines as directed by your health care provider.  If antibiotic medicine was prescribed, take it as directed. Make sure you finish it even if you start to feel better.  Do not have sex until treatment is completed.  Tell all sexual partners that you have a vaginal infection. They should see their health care provider and be treated if they have problems, such as a mild rash or itching.  Practice safe sex by using condoms and only having one sex partner. SEEK MEDICAL CARE IF:   Your symptoms are not improving after 3 days of treatment.  You have increased discharge or pain.  You have a fever. MAKE SURE YOU:   Understand these instructions.  Will watch your condition.  Will get help right away if  you are not doing well or get worse. FOR MORE INFORMATION  Centers for Disease Control and Prevention, Division of STD Prevention: AppraiserFraud.fi American Sexual Health Association (ASHA): www.ashastd.org  Document Released: 05/15/2005 Document Revised: 03/05/2013 Document Reviewed: 12/25/2012 [facility] Patient Information 2015 [place], [place]. This information is not intended to replace advice given to you by your health care provider. Make sure you discuss any questions you have with your health care provider.

## 2014-08-14 ENCOUNTER — Ambulatory Visit (HOSPITAL_COMMUNITY)

## 2014-08-25 ENCOUNTER — Telehealth: Payer: Self-pay | Admitting: *Deleted

## 2014-08-25 NOTE — Telephone Encounter (Signed)
Patient has been made aware of abnormal pap results and recommendation is for a colposcopy. Attempted to contact the patient to schedule the appointment and left a message for the patient to call the office.

## 2014-09-04 NOTE — Telephone Encounter (Signed)
Patient contacted the office. Returned patient's call 09-04-14 @ 2:55 pm. Left message for patient to contact the office.

## 2014-09-11 NOTE — Telephone Encounter (Signed)
Patient returned the call. Attempted to contact the patient and left a message for patient to contact the office.

## 2014-09-15 NOTE — Telephone Encounter (Signed)
Patient has been scheduled for her colposcopy. Patient's LMP is 09-09-14 and she is sexually active and using contraception. Patient to see Dr. Jodi Mourning on 09-23-14 2 10:45 am.

## 2014-09-23 ENCOUNTER — Ambulatory Visit (INDEPENDENT_AMBULATORY_CARE_PROVIDER_SITE_OTHER): Admitting: Obstetrics

## 2014-09-23 ENCOUNTER — Encounter: Payer: Self-pay | Admitting: Obstetrics

## 2014-09-23 ENCOUNTER — Other Ambulatory Visit: Payer: Self-pay | Admitting: Obstetrics

## 2014-09-23 VITALS — BP 107/74 | HR 55 | Temp 98.2°F | Wt 293.0 lb

## 2014-09-23 DIAGNOSIS — R87612 Low grade squamous intraepithelial lesion on cytologic smear of cervix (LGSIL): Secondary | ICD-10-CM | POA: Diagnosis not present

## 2014-09-23 DIAGNOSIS — Z3041 Encounter for surveillance of contraceptive pills: Secondary | ICD-10-CM

## 2014-09-23 DIAGNOSIS — N760 Acute vaginitis: Secondary | ICD-10-CM

## 2014-09-23 DIAGNOSIS — Z01812 Encounter for preprocedural laboratory examination: Secondary | ICD-10-CM

## 2014-09-23 DIAGNOSIS — B9689 Other specified bacterial agents as the cause of diseases classified elsewhere: Secondary | ICD-10-CM

## 2014-09-23 LAB — POCT URINE PREGNANCY: Preg Test, Ur: NEGATIVE

## 2014-09-23 MED ORDER — NORETHINDRONE 0.35 MG PO TABS: 1.0000 | ORAL_TABLET | Freq: Every day | ORAL | Status: DC

## 2014-09-23 NOTE — Progress Notes (Signed)
Colposcopy Procedure Note  Indications: Pap smear 1 months ago showed: low-grade squamous intraepithelial neoplasia (LGSIL - encompassing HPV,mild dysplasia,CIN I). The prior pap showed no abnormalities.  Prior cervical/vaginal disease: normal exam without visible pathology. Prior cervical treatment: no treatment.  Procedure Details  The risks and benefits of the procedure and Written informed consent obtained.  A time-out was performed confirming the patient, procedure and allergy status  Speculum placed in vagina and excellent visualization of cervix achieved, cervix swabbed x 3 with acetic acid solution.  Findings: Cervix: no visible lesions; SCJ visualized 360 degrees without lesions, endocervical curettage performed, cervical biopsies taken at 12 o'clock, specimen labelled and sent to pathology and hemostasis achieved with silver nitrate.   Vaginal inspection: normal without visible lesions. Vulvar colposcopy: vulvar colposcopy not performed.   Physical Exam   Specimens: ECC and Cervical biopsy  Complications: none.  Plan: Specimens labelled and sent to Pathology. Will base further treatment on Pathology findings. Post biopsy instructions given to patient. Return to discuss Pathology results in 2 weeks.

## 2014-10-07 ENCOUNTER — Ambulatory Visit: Admitting: Obstetrics

## 2014-10-13 ENCOUNTER — Other Ambulatory Visit (HOSPITAL_COMMUNITY): Payer: Self-pay | Admitting: Obstetrics and Gynecology

## 2014-10-13 DIAGNOSIS — Z3149 Encounter for other procreative investigation and testing: Secondary | ICD-10-CM

## 2014-10-19 ENCOUNTER — Ambulatory Visit (HOSPITAL_COMMUNITY)
Admission: RE | Admit: 2014-10-19 | Discharge: 2014-10-19 | Disposition: A | Discharge status: Home / Self Care | Source: Ambulatory Visit | Attending: Obstetrics and Gynecology | Admitting: Obstetrics and Gynecology

## 2014-10-19 DIAGNOSIS — N979 Female infertility, unspecified: Secondary | ICD-10-CM | POA: Diagnosis not present

## 2014-10-19 DIAGNOSIS — Z3149 Encounter for other procreative investigation and testing: Secondary | ICD-10-CM

## 2014-10-19 MED ORDER — IOHEXOL 300 MG/ML  SOLN
30.0000 mL | Freq: Once | INTRAMUSCULAR | Status: AC | PRN
  Administered 2014-10-19: 30 mL

## 2015-03-13 ENCOUNTER — Emergency Department (HOSPITAL_BASED_OUTPATIENT_CLINIC_OR_DEPARTMENT_OTHER)
Admission: EM | Admit: 2015-03-13 | Discharge: 2015-03-13 | Disposition: A | Discharge status: Home / Self Care | Attending: Physician Assistant | Admitting: Physician Assistant

## 2015-03-13 ENCOUNTER — Emergency Department (HOSPITAL_BASED_OUTPATIENT_CLINIC_OR_DEPARTMENT_OTHER)

## 2015-03-13 ENCOUNTER — Encounter (HOSPITAL_BASED_OUTPATIENT_CLINIC_OR_DEPARTMENT_OTHER): Payer: Self-pay | Admitting: *Deleted

## 2015-03-13 ENCOUNTER — Other Ambulatory Visit: Payer: Self-pay

## 2015-03-13 DIAGNOSIS — Z79899 Other long term (current) drug therapy: Secondary | ICD-10-CM | POA: Diagnosis not present

## 2015-03-13 DIAGNOSIS — Z86718 Personal history of other venous thrombosis and embolism: Secondary | ICD-10-CM | POA: Insufficient documentation

## 2015-03-13 DIAGNOSIS — Z8742 Personal history of other diseases of the female genital tract: Secondary | ICD-10-CM | POA: Diagnosis not present

## 2015-03-13 DIAGNOSIS — D649 Anemia, unspecified: Secondary | ICD-10-CM | POA: Insufficient documentation

## 2015-03-13 DIAGNOSIS — R059 Cough, unspecified: Secondary | ICD-10-CM

## 2015-03-13 DIAGNOSIS — R05 Cough: Secondary | ICD-10-CM | POA: Insufficient documentation

## 2015-03-13 DIAGNOSIS — E669 Obesity, unspecified: Secondary | ICD-10-CM | POA: Diagnosis not present

## 2015-03-13 HISTORY — DX: Encounter for other specified aftercare: Z51.89

## 2015-03-13 LAB — URINE MICROSCOPIC-ADD ON

## 2015-03-13 LAB — BASIC METABOLIC PANEL
ANION GAP: 3 — AB (ref 5–15)
BUN: 10 mg/dL (ref 6–20)
CO2: 23 mmol/L (ref 22–32)
Calcium: 8.4 mg/dL — ABNORMAL LOW (ref 8.9–10.3)
Chloride: 112 mmol/L — ABNORMAL HIGH (ref 101–111)
Creatinine, Ser: 0.68 mg/dL (ref 0.44–1.00)
Glucose, Bld: 91 mg/dL (ref 65–99)
POTASSIUM: 4 mmol/L (ref 3.5–5.1)
SODIUM: 138 mmol/L (ref 135–145)

## 2015-03-13 LAB — CBC WITH DIFFERENTIAL/PLATELET
BASOS ABS: 0 10*3/uL (ref 0.0–0.1)
Basophils Relative: 0 %
EOS ABS: 0.2 10*3/uL (ref 0.0–0.7)
Eosinophils Relative: 2 %
HCT: 37.8 % (ref 36.0–46.0)
Hemoglobin: 12.1 g/dL (ref 12.0–15.0)
Lymphocytes Relative: 28 %
Lymphs Abs: 2.2 10*3/uL (ref 0.7–4.0)
MCH: 25.6 pg — ABNORMAL LOW (ref 26.0–34.0)
MCHC: 32 g/dL (ref 30.0–36.0)
MCV: 80.1 fL (ref 78.0–100.0)
MONO ABS: 0.7 10*3/uL (ref 0.1–1.0)
Monocytes Relative: 9 %
NEUTROS PCT: 61 %
Neutro Abs: 4.7 10*3/uL (ref 1.7–7.7)
PLATELETS: 278 10*3/uL (ref 150–400)
RBC: 4.72 MIL/uL (ref 3.87–5.11)
RDW: 14.8 % (ref 11.5–15.5)
WBC: 7.8 10*3/uL (ref 4.0–10.5)

## 2015-03-13 LAB — URINALYSIS, ROUTINE W REFLEX MICROSCOPIC
GLUCOSE, UA: NEGATIVE mg/dL
KETONES UR: 15 mg/dL — AB
NITRITE: NEGATIVE
PROTEIN: NEGATIVE mg/dL
Specific Gravity, Urine: 1.028 (ref 1.005–1.030)
Urobilinogen, UA: 1 mg/dL (ref 0.0–1.0)
pH: 5 (ref 5.0–8.0)

## 2015-03-13 MED ORDER — BENZONATATE 100 MG PO CAPS: 100.0000 mg | ORAL_CAPSULE | Freq: Three times a day (TID) | ORAL | Status: DC | PRN

## 2015-03-13 NOTE — ED Notes (Signed)
Pt reports left [place] on October 5. Has been taking anti-malarial medication before and during her travel. Reports persistent cough, denies fever. This morning pt c/o pain in her left ribs/back with breathing. Saw PCP on Monday and received flu shot

## 2015-03-13 NOTE — ED Provider Notes (Signed)
CSN: [id number]     Arrival date & time 03/13/15  1332 History   First MD Initiated Contact with Patient 03/13/15 1358     Chief Complaint  Patient presents with  . Cough     (Consider location/radiation/quality/duration/timing/severity/associated sxs/prior Treatment) HPI   Patient is a 39 year old female presenting with cough. Patient had cough for the last 3 weeks. Patient seen by her PCP earlier this week. Patient reports that she went to [place] 2 weeks ago. That she took malarone pills however she is worried that she may have malaria or something else. She's had no fevers. Patient was also reports that she's been bleeding since the fifth of this month. She reports sometimes she has has heavy mensis. She states that one time she had to get a blood tranfusion when she was on Coumadin and had mensis. 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  Past Medical History  Diagnosis Date  . Heavy menses   . DVT (deep venous thrombosis) ([place])   . Anemia     due to blood loss  . Blood transfusion without reported diagnosis    Past Surgical History  Procedure Laterality Date  . Gastric bypass  July 2013  . Excess skin removal     Family History  Problem Relation Age of Onset  . Diabetes Mother   . Cancer Father   . Heart disease Father    Social History  Substance Use Topics  . Smoking status: Never Smoker   . Smokeless tobacco: Never Used  . Alcohol Use: No   OB History    Gravida Para Term Preterm AB TAB SAB Ectopic Multiple Living   0 0 0 0 0 0 0 0 0 0      Review of Systems  Constitutional: Negative for fever, activity change and fatigue.  HENT: Negative for congestion.   Eyes: Negative for discharge.  Respiratory: Positive for cough. Negative for chest tightness.   Cardiovascular: Negative for chest pain.  Gastrointestinal: Negative for nausea, abdominal pain, diarrhea and abdominal distention.  Genitourinary:  Negative for dysuria and difficulty urinating.  Musculoskeletal: Negative for joint swelling.  Skin: Negative for rash.  Allergic/Immunologic: Negative for immunocompromised state.  Neurological: Negative for seizures and speech difficulty.  Psychiatric/Behavioral: Negative for behavioral problems and agitation.      Allergies  Review of patient's allergies indicates no known allergies.  Home Medications   Prior to Admission medications   Medication Sig Start Date End Date Taking? Authorizing Provider  atovaquone-proguanil (MALARONE) 250-100 MG TABS tablet Take 1 tablet by mouth daily.   Yes Historical Provider, MD  calcium-vitamin D (OSCAL WITH D) 500-200 MG-UNIT per tablet Take 1 tablet by mouth.   Yes Historical Provider, MD  Cyanocobalamin (VITAMIN B-12) 1000 MCG SUBL Place 1 tablet under the tongue. 06/19/13  Yes Historical Provider, MD  ferrous sulfate 325 (65 FE) MG tablet Take 325 mg by mouth.   Yes Historical Provider, MD  Multiple Vitamin (MULTI-VITAMINS) TABS Take 1 tablet by mouth.   Yes Historical Provider, MD  omeprazole (PRILOSEC) 20 MG capsule Take 20 mg by mouth daily as needed.    Yes Historical Provider, MD  fluconazole (DIFLUCAN) 100 MG tablet Take 1 tablet (100 mg total) by mouth once. Repeat in 48-72 hours. 08/11/14   Shelly Bombard, MD  HYDROcodone-acetaminophen (NORCO) 7.5-325 MG per tablet Take 1 tablet 1 hour prior to procedure. May use every 4-6 hours after procedure. 08/11/14   Shelly Bombard,  MD  norethindrone (MICRONOR,CAMILA,ERRIN) 0.35 MG tablet Take 1 tablet (0.35 mg total) by mouth daily. 09/23/14   Shelly Bombard, MD  UNKNOWN TO PATIENT Birth Control 1 tablet by mouth daily    Historical Provider, MD  Vitamin D, Ergocalciferol, (DRISDOL) 50000 UNITS CAPS capsule Take 50,000 Units by mouth every 7 (seven) days.    Historical Provider, MD   BP 123/77 mmHg  Pulse 76  Temp(Src) 98.3 F (36.8 C) (Oral)  Resp 18  Ht 5\' 4"  (1.626 m)  Wt 283 lb (128.368  kg)  BMI 48.55 kg/m2  SpO2 99%  LMP 02/27/2015 Physical Exam  Constitutional: She is oriented to person, place, and time. She appears well-developed and well-nourished.  obese  HENT:  Head: Normocephalic and atraumatic.  Eyes: Conjunctivae are normal. Right eye exhibits no discharge.  Neck: Neck supple.  Cardiovascular: Normal rate, regular rhythm and normal heart sounds.   No murmur heard. Pulmonary/Chest: Effort normal and breath sounds normal. She has no wheezes. She has no rales.  Abdominal: Soft. She exhibits no distension. There is no tenderness.  Musculoskeletal: Normal range of motion. She exhibits no edema.  Neurological: She is oriented to person, place, and time. No cranial nerve deficit.  Skin: Skin is warm and dry. No rash noted. She is not diaphoretic.  Psychiatric: She has a normal mood and affect. Her behavior is normal.  Nursing note and vitals reviewed.   ED Course  Procedures (including critical care time) Labs Review Labs Reviewed  CBC WITH DIFFERENTIAL/PLATELET  BASIC METABOLIC PANEL  URINALYSIS, ROUTINE W REFLEX MICROSCOPIC (NOT AT [facility])    Imaging Review Dg Chest 2 View  03/13/2015  CLINICAL DATA:  39 year old female with productive cough with clear mucus production after traveling to [place] 1 week ago. EXAM: CHEST  2 VIEW COMPARISON:  No priors. FINDINGS: Lung volumes are normal. No consolidative airspace disease. No pleural effusions. No pneumothorax. No pulmonary nodule or mass noted. Pulmonary vasculature and the cardiomediastinal silhouette are within normal limits. IMPRESSION: No radiographic evidence of acute cardiopulmonary disease. Electronically Signed   By: Vinnie Langton M.D.   On: 03/13/2015 14:20   I have personally reviewed and evaluated these images and lab results as part of my medical decision-making.   EKG Interpretation None      MDM   Final diagnoses:  None   patient is a 40 year old female who recently returned from  [place]. Patient's had a chronic cough for the last 3 weeks. No fevers. We will get chest x-ray to ensure there is no pneumonia. Patient had no shortness of breath or tachypnea. Do not suspect pulmonary embolism as she has no symptoms, bleeding or leg swelling.  Patient had a cough prior to her airplane travel. And  her previous DVT was provoked.  Patient concerned about malaria, I'm not concerned at this time given there is no is no fever.  Patient is also concerned about her hemoglobin because she's been having heavy menses. We'll check to make sure that her hemoglobin is okay.   Anticipate ability to discharge home given her normal physical exam and normal vital signs.  Taylinn Brabant Julio Alm, MD 03/13/15 1435

## 2015-03-13 NOTE — Discharge Instructions (Signed)
Follow upw with your PCP as neede. Your Hemoglobin was 12 today. Your chest xray was normal and your other labs appeared normal.  Cough, Adult A cough helps to clear your throat and lungs. A cough may last only 2-3 weeks (acute), or it may last longer than 8 weeks (chronic). Many different things can cause a cough. A cough may be a sign of an illness or another medical condition. HOME CARE  Pay attention to any changes in your cough.  Take medicines only as told by your doctor.  If you were prescribed an antibiotic medicine, take it as told by your doctor. Do not stop taking it even if you start to feel better.  Talk with your doctor before you try using a cough medicine.  Drink enough fluid to keep your pee (urine) clear or pale yellow.  If the air is dry, use a cold steam vaporizer or humidifier in your home.  Stay away from things that make you cough at work or at home.  If your cough is worse at night, try using extra pillows to raise your head up higher while you sleep.  Do not smoke, and try not to be around smoke. If you need help quitting, ask your doctor.  Do not have caffeine.  Do not drink alcohol.  Rest as needed. GET HELP IF:  You have new problems (symptoms).  You cough up yellow fluid (pus).  Your cough does not get better after 2-3 weeks, or your cough gets worse.  Medicine does not help your cough and you are not sleeping well.  You have pain that gets worse or pain that is not helped with medicine.  You have a fever.  You are losing weight and you do not know why.  You have night sweats. GET HELP RIGHT AWAY IF:  You cough up blood.  You have trouble breathing.  Your heartbeat is very fast.   This information is not intended to replace advice given to you by your health care provider. Make sure you discuss any questions you have with your health care provider.   Document Released: 01/26/2011 Document Revised: 02/03/2015 Document Reviewed:  07/22/2014 Elsevier Interactive Patient Education [employer].

## 2015-10-11 ENCOUNTER — Encounter: Payer: Self-pay | Admitting: Certified Nurse Midwife

## 2015-10-11 ENCOUNTER — Other Ambulatory Visit: Payer: Self-pay | Admitting: Certified Nurse Midwife

## 2016-04-29 IMAGING — DX DG CHEST 2V
2 series · 2 of 2 positions shown · non-contrast
Comparison: No priors.

CLINICAL DATA: 39-year-old female with productive cough with clear
mucus production after traveling to [place] 1 week ago.

EXAM:
CHEST  2 VIEW

[chest pa]
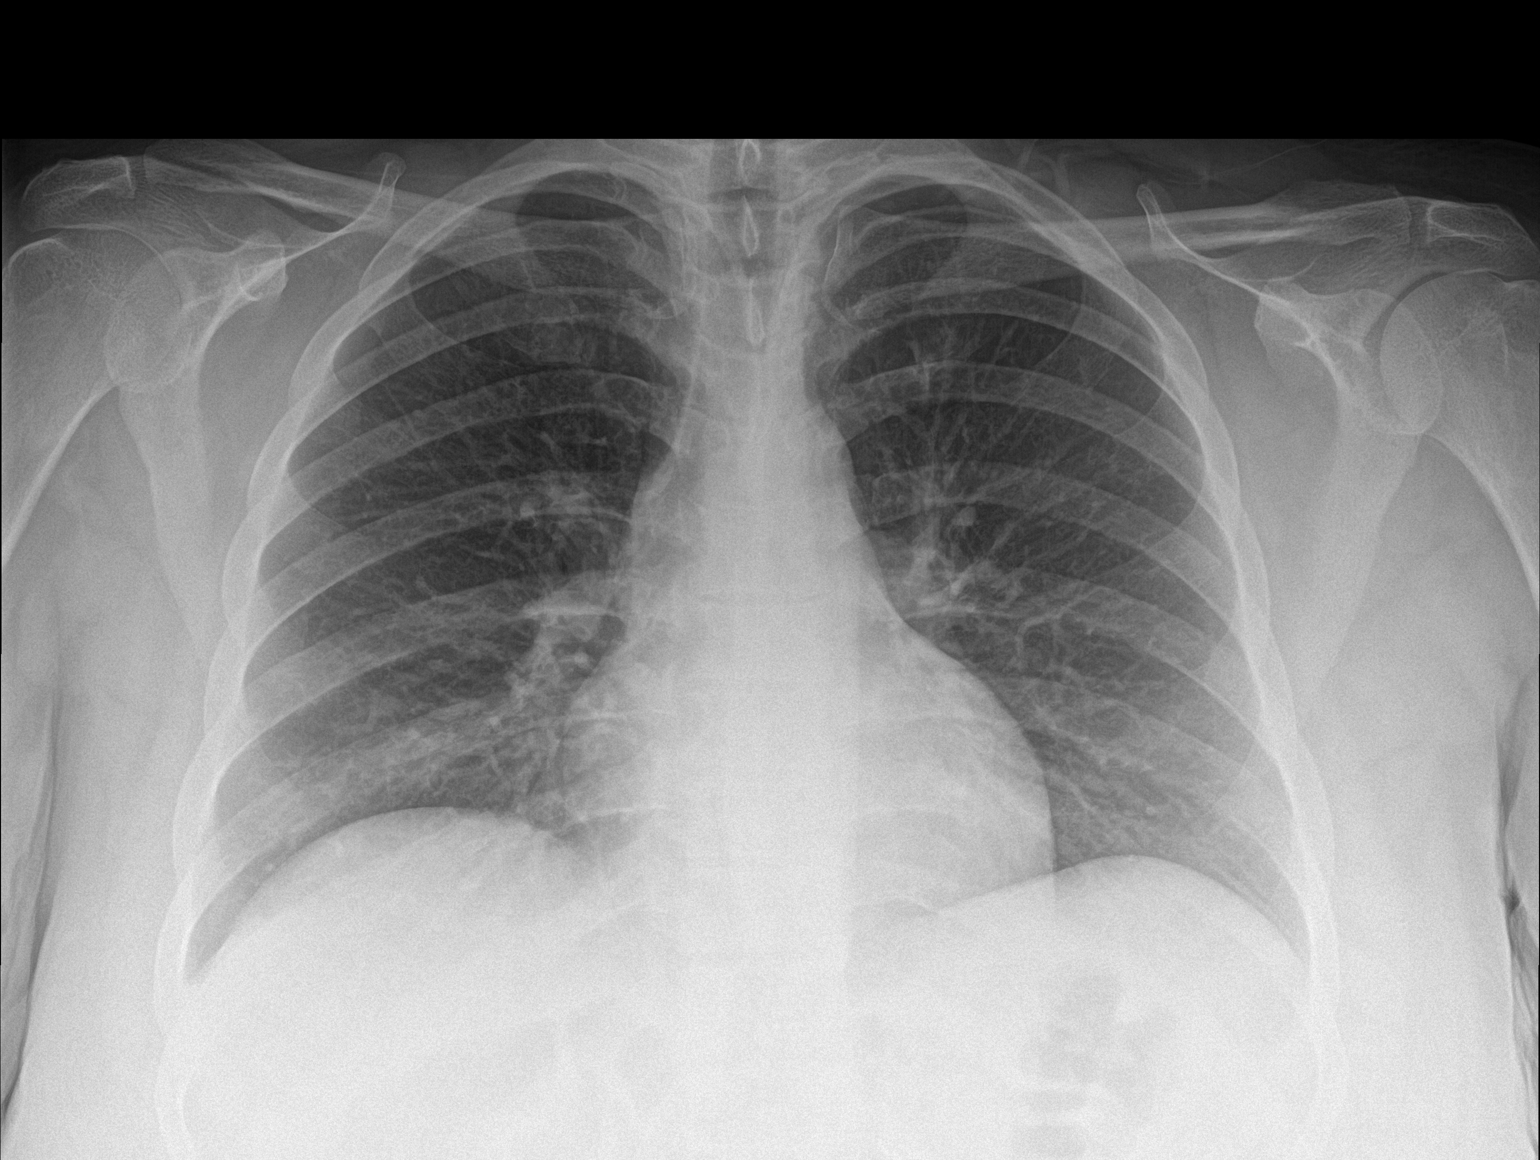

[chest lat]
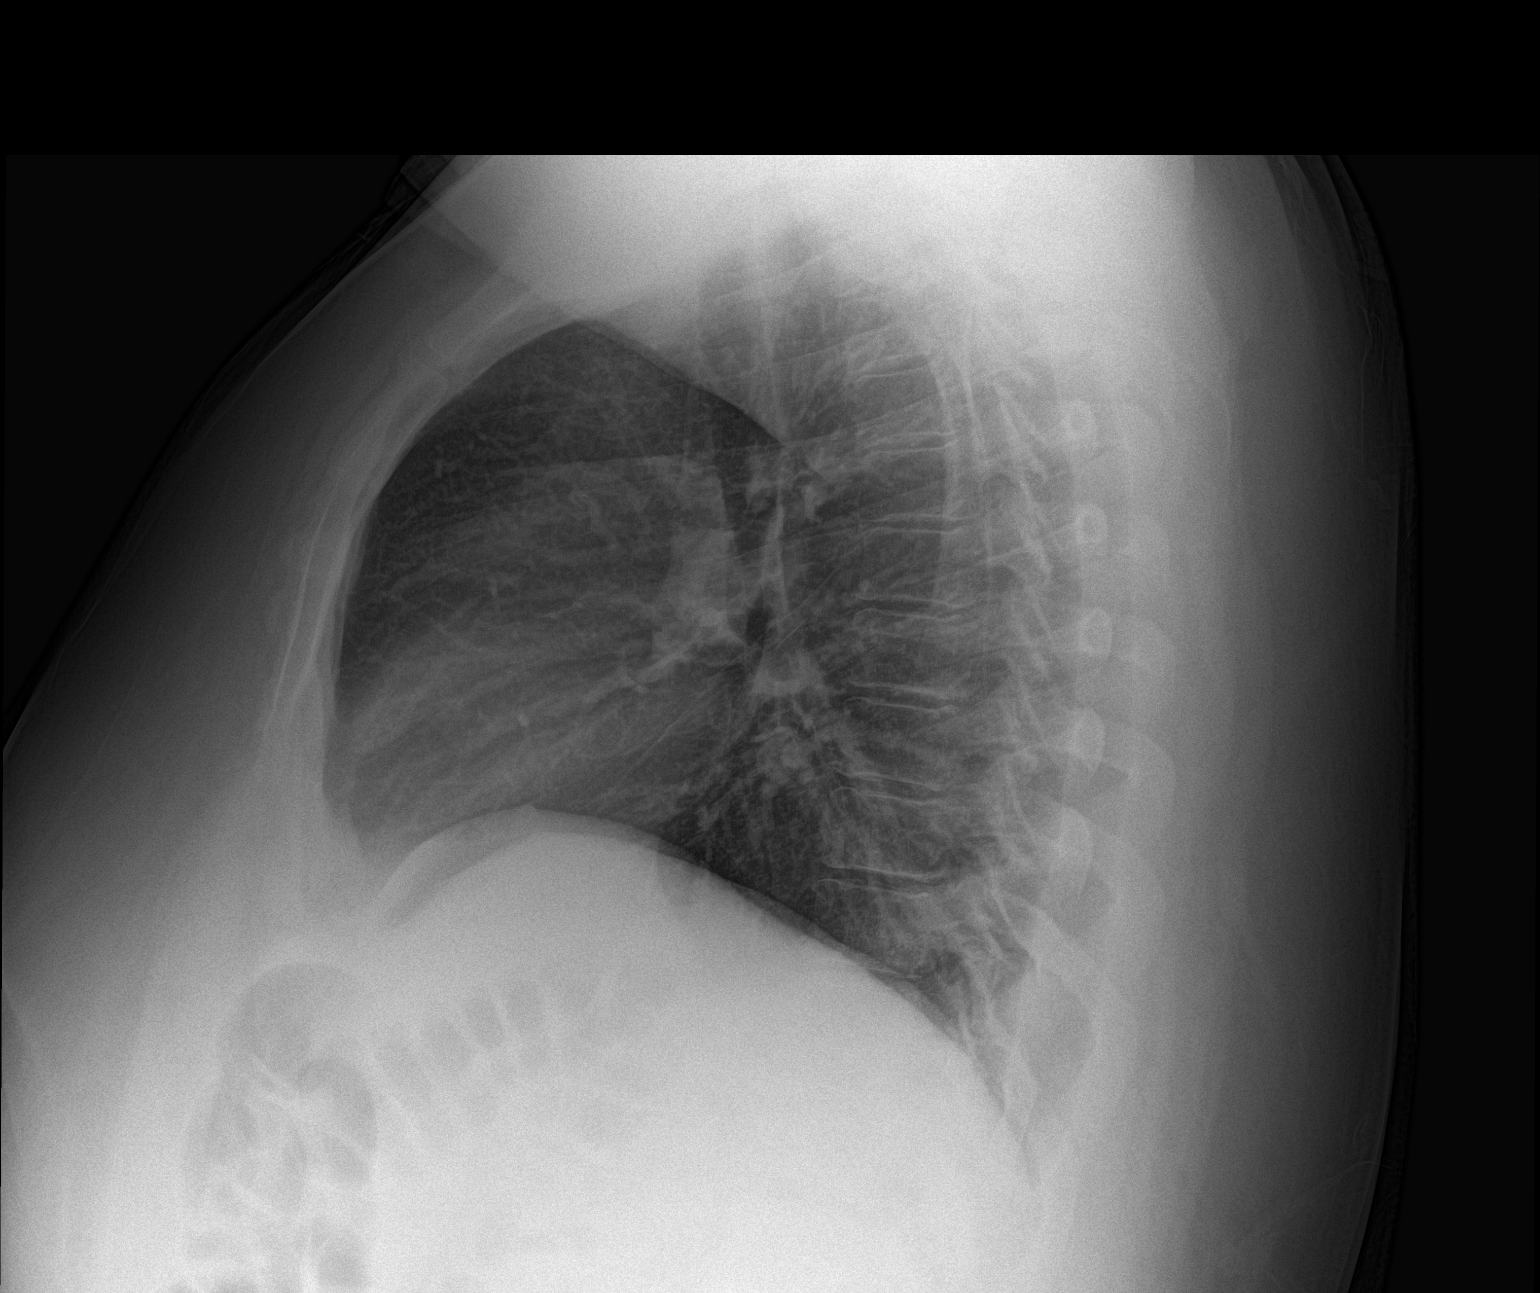

[2 of 2 positions shown; findings below may reference images not displayed]

FINDINGS: Lung volumes are normal. No consolidative airspace disease. No
pleural effusions. No pneumothorax. No pulmonary nodule or mass
noted. Pulmonary vasculature and the cardiomediastinal silhouette
are within normal limits.
IMPRESSION: No radiographic evidence of acute cardiopulmonary disease.

## 2016-05-31 ENCOUNTER — Encounter: Payer: Self-pay | Admitting: Obstetrics and Gynecology

## 2016-05-31 ENCOUNTER — Ambulatory Visit (INDEPENDENT_AMBULATORY_CARE_PROVIDER_SITE_OTHER): Payer: Self-pay | Admitting: Obstetrics and Gynecology

## 2016-05-31 ENCOUNTER — Other Ambulatory Visit: Payer: Self-pay

## 2016-05-31 DIAGNOSIS — O09519 Supervision of elderly primigravida, unspecified trimester: Secondary | ICD-10-CM | POA: Insufficient documentation

## 2016-05-31 DIAGNOSIS — Z3202 Encounter for pregnancy test, result negative: Secondary | ICD-10-CM

## 2016-05-31 DIAGNOSIS — O0990 Supervision of high risk pregnancy, unspecified, unspecified trimester: Secondary | ICD-10-CM | POA: Insufficient documentation

## 2016-05-31 DIAGNOSIS — O039 Complete or unspecified spontaneous abortion without complication: Secondary | ICD-10-CM

## 2016-05-31 DIAGNOSIS — O99210 Obesity complicating pregnancy, unspecified trimester: Secondary | ICD-10-CM

## 2016-05-31 LAB — POCT URINE PREGNANCY: PREG TEST UR: NEGATIVE

## 2016-05-31 NOTE — Progress Notes (Signed)
Patient presenting for initial prenatal visit. She reports experiencing some vaginal bleeding since mid- late November. She has been followed by her PCP with serial HCG. Her initial quant was 52 in late November. She reports a quant of 7 2 weeks ago. The vaginal bleeding has since resolved.   Past Medical History:  Diagnosis Date  . Anemia    due to blood loss  . Blood transfusion without reported diagnosis   . DVT (deep venous thrombosis) ([place])   . Heavy menses    Past Surgical History:  Procedure Laterality Date  . excess skin removal    . GASTRIC BYPASS  July 2013   Family History  Problem Relation Age of Onset  . Cancer Father   . Heart disease Father   . Diabetes Mother   . Hypertension Mother    Social History  Substance Use Topics  . Smoking status: Never Smoker  . Smokeless tobacco: Never Used  . Alcohol use No   ROS See pertinent in HPI  GENERAL: Well-developed, well-nourished female in no acute distress. Obese EXTREMITIES: No cyanosis, clubbing, or edema, 2+ distal pulses.  A/P 41 yo [id number] with spontaneous abortion Office UPT today is negative.  Patient desires repeat quant HCG today to confirm.  She plans to follow up with PCP. She plans to try to conceive again in the next 3 months.  Advised patient to start taking prenatal vitamins now.  RTC prn

## 2016-05-31 NOTE — Progress Notes (Signed)
Patient is in the office for initial ob visit, states that she had some bleeding and was seeing her primary care who informed her that her hcg levels were dropping, UPT was negative, informed provider, labs ordered.

## 2016-06-01 LAB — BETA HCG QUANT (REF LAB): hCG Quant: 4 m[IU]/mL

## 2018-03-24 ENCOUNTER — Other Ambulatory Visit: Payer: Self-pay | Admitting: Nurse Practitioner

## 2018-05-14 ENCOUNTER — Other Ambulatory Visit: Payer: Self-pay | Admitting: Nurse Practitioner

## 2018-06-25 ENCOUNTER — Other Ambulatory Visit: Payer: Self-pay | Admitting: Nurse Practitioner

## 2018-07-15 ENCOUNTER — Other Ambulatory Visit: Payer: Self-pay | Admitting: Nurse Practitioner

## 2018-07-29 NOTE — Telephone Encounter (Signed)
Can pt have a refill on this meds? 

## 2018-08-01 ENCOUNTER — Encounter: Payer: Self-pay | Admitting: Nurse Practitioner

## 2018-08-01 ENCOUNTER — Ambulatory Visit: Payer: BLUE CROSS/BLUE SHIELD | Admitting: Nurse Practitioner

## 2018-08-01 VITALS — BP 126/84 | HR 68 | Ht 64.0 in | Wt 315.4 lb

## 2018-08-01 DIAGNOSIS — Z6841 Body Mass Index (BMI) 40.0 and over, adult: Secondary | ICD-10-CM | POA: Diagnosis not present

## 2018-08-01 DIAGNOSIS — K219 Gastro-esophageal reflux disease without esophagitis: Secondary | ICD-10-CM | POA: Diagnosis not present

## 2018-08-01 DIAGNOSIS — Z9884 Bariatric surgery status: Secondary | ICD-10-CM

## 2018-08-01 MED ORDER — NALTREXONE-BUPROPION HCL ER 8-90 MG PO TB12: 2.0000 | ORAL_TABLET | Freq: Two times a day (BID) | ORAL | 1 refills | Status: AC

## 2018-08-01 MED ORDER — OMEPRAZOLE 20 MG PO CPDR: 20.0000 mg | DELAYED_RELEASE_CAPSULE | Freq: Every day | ORAL | 1 refills | Status: DC

## 2018-08-01 NOTE — Patient Instructions (Signed)
Obesity, Adult Obesity is having too much body fat. If you have a BMI of 30 or more, you are obese. BMI is a number that explains how much body fat you have. Obesity is often caused by taking in (consuming) more calories than your body uses. Obesity can cause serious health problems. Changing your lifestyle can help to treat obesity. Follow these instructions at home: Eating and drinking   Follow advice from your doctor about what to eat and drink. Your doctor may tell you to: ? Cut down on (limit) fast foods, sweets, and processed snack foods. ? Choose low-fat options. For example, choose low-fat milk instead of whole milk. ? Eat 5 or more servings of fruits or vegetables every day. ? Eat at home more often. This gives you more control over what you eat. ? Choose healthy foods when you eat out. ? Learn what a healthy portion size is. A portion size is the amount of a certain food that is healthy for you to eat at one time. This is different for each person. ? Keep low-fat snacks available. ? Avoid sugary drinks. These include soda, fruit juice, iced tea that is sweetened with sugar, and flavored milk. ? Eat a healthy breakfast.  Drink enough water to keep your pee (urine) clear or pale yellow.  Do not go without eating for long periods of time (do not fast).  Do not go on popular or trendy diets (fad diets). Physical Activity  Exercise often, as told by your doctor. Ask your doctor: ? What types of exercise are safe for you. ? How often you should exercise.  Warm up and stretch before being active.  Do slow stretching after being active (cool down).  Rest between times of being active. Lifestyle  Limit how much time you spend in front of your TV, computer, or video game system (be less sedentary).  Find ways to reward yourself that do not involve food.  Limit alcohol intake to no more than 1 drink a day for nonpregnant women and 2 drinks a day for men. One drink equals 12 oz  of beer, 5 oz of wine, or 1 oz of hard liquor. General instructions  Keep a weight loss journal. This can help you keep track of: ? The food that you eat. ? The exercise that you do.  Take over-the-counter and prescription medicines only as told by your doctor.  Take vitamins and supplements only as told by your doctor.  Think about joining a support group. Your doctor may be able to help with this.  Keep all follow-up visits as told by your doctor. This is important. Contact a doctor if:  You cannot meet your weight loss goal after you have changed your diet and lifestyle for 6 weeks. This information is not intended to replace advice given to you by your health care provider. Make sure you discuss any questions you have with your health care provider. Document Released: 08/07/2011 Document Revised: 10/21/2015 Document Reviewed: 03/03/2015 Elsevier Interactive Patient Education  2019 Elsevier Inc.  

## 2018-08-01 NOTE — Progress Notes (Signed)
Subjective:     Patient ID: Shelby Gardner , female    DOB: [birth year] , 43 y.o.   MRN: [id number]   Chief Complaint  Patient presents with  . weight check    weight check ,  having pain bloating in stomach , started ted about 2 month , nausea , no vomitting     HPI  Obesity - she had taken Belviq twice a day for a while had some weight loss.  It controlled her appetite.  She is now going back to the once per week, due to being busy.  She is trying to eat healthier, she is trying to avoid meat until Sunday (chicken baked).  She admits to eating increased amounts of bread. She had gastric bypass 2013. Weight at the start was around 435 lbs.  Drinks 3 - 16 oz bottles of water a day.   Goals - to be at 280 lb final after skin removal.  Short term to get to 300 lbs.      Past Medical History:  Diagnosis Date  . Anemia    due to blood loss  . Blood transfusion without reported diagnosis   . DVT (deep venous thrombosis) ([place])   . Heavy menses      Family History  Problem Relation Age of Onset  . Cancer Father   . Heart disease Father   . Diabetes Mother   . Hypertension Mother      Current Outpatient Medications:  .  calcium-vitamin D (OSCAL WITH D) 500-200 MG-UNIT per tablet, Take 1 tablet by mouth., Disp: , Rfl:  .  Cyanocobalamin (VITAMIN B-12) 1000 MCG SUBL, Place 1 tablet under the tongue., Disp: , Rfl:  .  ferrous sulfate 325 (65 FE) MG tablet, Take 325 mg by mouth., Disp: , Rfl:  .  Multiple Vitamin (MULTI-VITAMINS) TABS, Take 1 tablet by mouth., Disp: , Rfl:  .  Vitamin D, Ergocalciferol, (DRISDOL) 1.25 MG (50000 UT) CAPS capsule, TAKE ONE CAPSULE BY MOUTH ONE TIME PER WEEK, Disp: 12 capsule, Rfl: 1   No Known Allergies   Review of Systems  Constitutional: Negative for fatigue.  Respiratory: Negative.  Negative for cough.   Cardiovascular: Negative.  Negative for chest pain, palpitations and leg swelling.  Endocrine: Negative for polydipsia, polyphagia and  polyuria.  Neurological: Negative for dizziness and headaches.     Today's Vitals   08/01/18 1153  BP: 126/84  Pulse: 68  SpO2: 94%  Weight: (!) 315 lb 6.4 oz (143.1 kg)  Height: 5\' 4"  (1.626 m)   Body mass index is 54.14 kg/m.   Objective:  Physical Exam Vitals signs reviewed.  Constitutional:      Appearance: Normal appearance.  Cardiovascular:     Rate and Rhythm: Normal rate and regular rhythm.     Pulses: Normal pulses.     Heart sounds: Normal heart sounds. No murmur.  Pulmonary:     Effort: Pulmonary effort is normal. No respiratory distress.     Breath sounds: Normal breath sounds.  Skin:    General: Skin is warm and dry.     Capillary Refill: Capillary refill takes less than 2 seconds.  Neurological:     General: No focal deficit present.     Mental Status: She is alert and oriented to person, place, and time.         Assessment And Plan:     1. Class 3 severe obesity due to excess calories without serious comorbidity with body mass index (  BMI) of 50.0 to 59.9 in adult [facility])  Chronic  Discussed healthy diet and regular exercise options   Encouraged to exercise at least 150 minutes per week with 2 days of strength training  Given handout for [email or web address] 10 tips, CDC exercise for adults and CDC Eat More Weight Less  Will start contrave pending insurance approval she is to titrate weekly, discussed side effects of nausea, abdominal pain or difficulty swallowing to notify office.  Discussed side effects, teaching done   Return in 2 months for weight check. - Naltrexone-buPROPion HCl ER 8-90 MG TB12; Take 2 tablets by mouth 2 (two) times daily.  Dispense: 120 tablet; Refill: 1  2. Gastroesophageal reflux disease without esophagitis  She is to restart omprazole as needed.   - omeprazole (PRILOSEC) 20 MG capsule; Take 1 capsule (20 mg total) by mouth daily.  Dispense: 30 capsule; Refill: [place], FNP

## 2018-08-06 ENCOUNTER — Other Ambulatory Visit: Payer: Self-pay | Admitting: Nurse Practitioner

## 2018-09-27 ENCOUNTER — Other Ambulatory Visit: Payer: Self-pay | Admitting: Nurse Practitioner

## 2018-09-27 DIAGNOSIS — K219 Gastro-esophageal reflux disease without esophagitis: Secondary | ICD-10-CM

## 2018-10-22 ENCOUNTER — Other Ambulatory Visit: Payer: Self-pay | Admitting: Nurse Practitioner

## 2018-11-04 ENCOUNTER — Encounter: Admitting: Nurse Practitioner

## 2019-05-26 ENCOUNTER — Other Ambulatory Visit: Payer: Self-pay | Admitting: Nurse Practitioner

## 2019-05-27 ENCOUNTER — Other Ambulatory Visit: Payer: Self-pay | Admitting: Nurse Practitioner

## 2019-06-27 ENCOUNTER — Other Ambulatory Visit: Payer: Self-pay | Admitting: Nurse Practitioner

## 2019-06-27 DIAGNOSIS — K219 Gastro-esophageal reflux disease without esophagitis: Secondary | ICD-10-CM

## 2019-07-07 ENCOUNTER — Other Ambulatory Visit: Payer: Self-pay | Admitting: Nurse Practitioner

## 2019-07-09 ENCOUNTER — Other Ambulatory Visit: Payer: Self-pay | Admitting: Nurse Practitioner

## 2023-10-31 ENCOUNTER — Encounter: Payer: Self-pay | Admitting: Hematology & Oncology

## 2023-11-02 ENCOUNTER — Other Ambulatory Visit: Payer: Self-pay | Admitting: Family

## 2023-11-02 ENCOUNTER — Inpatient Hospital Stay (HOSPITAL_BASED_OUTPATIENT_CLINIC_OR_DEPARTMENT_OTHER): Admitting: Family

## 2023-11-02 ENCOUNTER — Encounter: Payer: Self-pay | Admitting: Family

## 2023-11-02 ENCOUNTER — Inpatient Hospital Stay: Attending: Hematology & Oncology

## 2023-11-02 VITALS — BP 129/83 | HR 74 | Resp 16 | Ht 65.0 in | Wt 304.0 lb

## 2023-11-02 DIAGNOSIS — Z9884 Bariatric surgery status: Secondary | ICD-10-CM | POA: Insufficient documentation

## 2023-11-02 DIAGNOSIS — D6859 Other primary thrombophilia: Secondary | ICD-10-CM

## 2023-11-02 DIAGNOSIS — I824Y9 Acute embolism and thrombosis of unspecified deep veins of unspecified proximal lower extremity: Secondary | ICD-10-CM | POA: Diagnosis not present

## 2023-11-02 DIAGNOSIS — Z7901 Long term (current) use of anticoagulants: Secondary | ICD-10-CM | POA: Insufficient documentation

## 2023-11-02 DIAGNOSIS — I825Z1 Chronic embolism and thrombosis of unspecified deep veins of right distal lower extremity: Secondary | ICD-10-CM | POA: Diagnosis present

## 2023-11-02 DIAGNOSIS — Z800 Family history of malignant neoplasm of digestive organs: Secondary | ICD-10-CM | POA: Insufficient documentation

## 2023-11-02 LAB — CBC WITH DIFFERENTIAL (CANCER CENTER ONLY)
Abs Immature Granulocytes: 0.01 10*3/uL (ref 0.00–0.07)
Basophils Absolute: 0.1 10*3/uL (ref 0.0–0.1)
Basophils Relative: 1 %
Eosinophils Absolute: 0.1 10*3/uL (ref 0.0–0.5)
Eosinophils Relative: 2 %
HCT: 35.2 % — ABNORMAL LOW (ref 36.0–46.0)
Hemoglobin: 10.7 g/dL — ABNORMAL LOW (ref 12.0–15.0)
Immature Granulocytes: 0 %
Lymphocytes Relative: 35 %
Lymphs Abs: 2.3 10*3/uL (ref 0.7–4.0)
MCH: 22.7 pg — ABNORMAL LOW (ref 26.0–34.0)
MCHC: 30.4 g/dL (ref 30.0–36.0)
MCV: 74.7 fL — ABNORMAL LOW (ref 80.0–100.0)
Monocytes Absolute: 0.6 10*3/uL (ref 0.1–1.0)
Monocytes Relative: 9 %
Neutro Abs: 3.5 10*3/uL (ref 1.7–7.7)
Neutrophils Relative %: 53 %
Platelet Count: 263 10*3/uL (ref 150–400)
RBC: 4.71 MIL/uL (ref 3.87–5.11)
RDW: 20.8 % — ABNORMAL HIGH (ref 11.5–15.5)
Smear Review: NORMAL
WBC Count: 6.6 10*3/uL (ref 4.0–10.5)
nRBC: 0 % (ref 0.0–0.2)

## 2023-11-02 LAB — LACTATE DEHYDROGENASE: LDH: 123 U/L (ref 98–192)

## 2023-11-02 LAB — CMP (CANCER CENTER ONLY)
ALT: 11 U/L (ref 0–44)
AST: 15 U/L (ref 15–41)
Albumin: 4.1 g/dL (ref 3.5–5.0)
Alkaline Phosphatase: 60 U/L (ref 38–126)
Anion gap: 8 (ref 5–15)
BUN: 15 mg/dL (ref 6–20)
CO2: 22 mmol/L (ref 22–32)
Calcium: 9.3 mg/dL (ref 8.9–10.3)
Chloride: 107 mmol/L (ref 98–111)
Creatinine: 0.76 mg/dL (ref 0.44–1.00)
GFR, Estimated: >60 mL/min (ref >60)
Glucose, Bld: 82 mg/dL (ref 70–99)
Potassium: 4.4 mmol/L (ref 3.5–5.1)
Sodium: 137 mmol/L (ref 135–145)
Total Bilirubin: 0.2 mg/dL (ref 0.0–1.2)
Total Protein: 7.7 g/dL (ref 6.5–8.1)

## 2023-11-02 LAB — ANTITHROMBIN III: AntiThromb III Func: 119 % (ref 75–120)

## 2023-11-02 LAB — D-DIMER, QUANTITATIVE: D-Dimer, Quant: 0.54 ug{FEU}/mL — ABNORMAL HIGH (ref 0.00–0.50)

## 2023-11-03 LAB — BETA-2-GLYCOPROTEIN I ABS, IGG/M/A
Beta-2 Glyco I IgG: <9 GPI IgG units (ref 0–20)
Beta-2-Glycoprotein I IgA: <9 GPI IgA units (ref 0–25)
Beta-2-Glycoprotein I IgM: <9 GPI IgM units (ref 0–32)

## 2023-11-03 LAB — PROTEIN S ACTIVITY: Protein S Activity: 59 % — ABNORMAL LOW (ref 63–140)

## 2023-11-03 LAB — DRVVT CONFIRM: dRVVT Confirm: 1.3 ratio — ABNORMAL HIGH (ref 0.8–1.2)

## 2023-11-03 LAB — PROTEIN C ACTIVITY: Protein C Activity: 89 % (ref 73–180)

## 2023-11-03 LAB — LUPUS ANTICOAGULANT PANEL
DRVVT: 74 s — ABNORMAL HIGH (ref 0.0–47.0)
PTT Lupus Anticoagulant: 32.8 s (ref 0.0–43.5)

## 2023-11-03 LAB — DRVVT MIX: dRVVT Mix: 56.2 s — ABNORMAL HIGH (ref 0.0–40.4)

## 2023-11-03 LAB — PROTEIN S, TOTAL: Protein S Ag, Total: 68 % (ref 60–150)

## 2023-11-04 LAB — PROTEIN C, TOTAL: Protein C, Total: 95 % (ref 60–150)

## 2023-11-05 LAB — CARDIOLIPIN ANTIBODIES, IGG, IGM, IGA
Anticardiolipin IgA: <9 U/mL (ref 0–11)
Anticardiolipin IgG: <9 GPL U/mL (ref 0–14)
Anticardiolipin IgM: <9 [MPL'U]/mL (ref 0–12)

## 2023-11-05 MED ORDER — XARELTO 20 MG PO TABS: 20.0000 mg | ORAL_TABLET | Freq: Every day | ORAL | 6 refills | Status: AC

## 2023-11-05 NOTE — Progress Notes (Signed)
 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  She states that she had it her leg on the steering wheel of her car and noticed a knot pop up.  Her first RLE DVT occurred after she had panniculectomy in 2014 after having had gastric bypass in 2013. She was initially treated with Xarelto  but had to transition to Lovenox  injections due to heavy cycle. She was on the Lovenox  for around 3 months and states that repeat scan showed resolution of DVT.  She denies history of stroke or heart attack.  She is tolerating Xarelto  20 mg PO daily nicely.  No issue with bleeding, bruising or petechiae.  She states that her paternal uncle also has history of DVTs.  She has history of 1 miscarriage in 2017. No children.  She has not had a cycle in 2 years.  She has occasional palpitations with over exertion.  No history of diabetes or thyroid disease.  She had her colonoscopy back in 2015 during work up for IDA and this was negative.  She states that she is due for her mammogram.  Her father had history of esophageal and stomach cancer.  No fever, chills, n/v, cough, rash, dizziness, SOB, chest pain, palpitations, abdominal pain or changes in bowel or bladder habits at this time.  Swelling in the right leg is much improved. No redness or edema noted in either leg.   Pedal pulses are 1+.  No numbness or tingling.  No falls or syncope reported.  No history of smoking,ETOH or recreational drug use.  Appetite and hydration are good. She does eat meat. Weight is stable at  304 lbs.  She recently finished truck driving school and is waiting until her DVT resolves to start.  ROS: All other 10 point review of systems is negative.   PAST MEDICAL HISTORY:   Past Medical History:  Diagnosis Date   Anemia    due to blood loss   Blood transfusion without reported diagnosis    DVT (deep venous thrombosis) (HCC)    Heavy menses     ALLERGIES: No Known Allergies    MEDICATIONS:  Current Outpatient Medications on File Prior to Visit  Medication Sig Dispense Refill   XARELTO  20 MG TABS tablet Take 20 mg by mouth daily.     ferrous sulfate 325 (65 FE) MG tablet Take 325 mg by mouth.     Multiple Vitamin (MULTI-VITAMINS) TABS Take 1 tablet by mouth.     Naltrexone -buPROPion  HCl ER 8-90 MG TB12 Take 2 tablets by mouth 2 (two) times daily. 120 tablet 1   omeprazole  (PRILOSEC) 20 MG capsule TAKE 1 CAPSULE BY MOUTH EVERY DAY 90 capsule 0   triamcinolone ointment (KENALOG) 0.1 % APPLY TO AFFECTED AREA TWICE A DAY 15 g 1   Vitamin D, Ergocalciferol, (DRISDOL) 1.25 MG (50000 UT) CAPS capsule TAKE 1 CAPSULE BY MOUTH ONCE A WEEK 12 capsule 1   No current facility-administered medications on file prior to visit.     PAST SURGICAL HISTORY Past Surgical History:  Procedure Laterality Date   excess skin removal     GASTRIC BYPASS  July 2013    FAMILY HISTORY: Family History  Problem Relation Age of Onset   Cancer Father    Heart disease Father    Diabetes Mother    Hypertension Mother     SOCIAL HISTORY:  reports that she has never smoked. She has never used smokeless tobacco. She reports that she does not drink alcohol and does not use drugs.  PERFORMANCE STATUS: The patient's performance status is 1 - Symptomatic but completely ambulatory  PHYSICAL EXAM: Most Recent Vital Signs: Blood pressure 129/83, pulse 74, resp. rate 16, height 5\' 5"  (1.651 m), weight (!) 304 lb (137.9 kg), SpO2 96%, unknown if currently breastfeeding. BP 129/83 (BP Location: Right  Arm, Patient Position: Sitting)   Pulse 74   Resp 16   Ht 5\' 5"  (1.651 m)   Wt (!) 304 lb (137.9 kg)   SpO2 96%   BMI 50.59 kg/m   General Appearance:    Alert, cooperative, no distress, appears stated age  Head:    Normocephalic, without obvious abnormality, atraumatic  Eyes:    PERRL, conjunctiva/corneas clear, EOM's intact, fundi    benign, both eyes        Throat:   Lips, mucosa, and tongue normal; teeth and gums normal  Neck:   Supple, symmetrical, trachea midline, no adenopathy;    thyroid:  no enlargement/tenderness/nodules; no carotid   bruit or JVD  Back:     Symmetric, no curvature, ROM normal, no CVA tenderness  Lungs:     Clear to auscultation bilaterally, respirations unlabored  Chest Wall:    No tenderness or deformity   Heart:    Regular rate and rhythm, S1 and S2 normal, no murmur, rub   or gallop     Abdomen:     Soft, non-tender, bowel sounds active all four quadrants,    no masses, no organomegaly        Extremities:   Extremities normal, atraumatic, no cyanosis or edema  Pulses:   2+ and symmetric all extremities  Skin:   Skin color, texture, turgor normal, no rashes or lesions  Lymph nodes:   Cervical, supraclavicular, and axillary nodes normal  Neurologic:   CNII-XII intact, normal strength, sensation and reflexes    throughout    LABORATORY DATA:  No results found for this or any previous visit (from the past 48 hours).    RADIOGRAPHY: No results found.     PATHOLOGY: None  ASSESSMENT/PLAN: Shelby Gardner is a very pleasant 48 yo female with new recurrent RLE DVT while undergoing treatment on her veins.  Hyper coag panel is pending.  Continue same regimen with Xarelto  20 mg PO daily. Script refilled.  We will plan to see her again in 6 weeks for follow-up and repeat US .   All questions were answered. The patient knows to call the clinic with any problems, questions or concerns. We can certainly see the patient much sooner if necessary.  The  patient was discussed with Dr. Maria Shiner and he is in agreement with the aforementioned.   Kennard Pea, NP

## 2023-11-06 LAB — HOMOCYSTEINE: Homocysteine: 16.9 umol/L — ABNORMAL HIGH (ref 0.0–14.5)

## 2023-11-07 LAB — PROTHROMBIN GENE MUTATION

## 2023-11-07 LAB — FACTOR 5 LEIDEN

## 2023-11-09 ENCOUNTER — Other Ambulatory Visit: Payer: Self-pay | Admitting: Family

## 2023-11-09 ENCOUNTER — Encounter: Payer: Self-pay | Admitting: *Deleted

## 2023-11-09 DIAGNOSIS — E7211 Homocystinuria: Secondary | ICD-10-CM

## 2023-11-09 DIAGNOSIS — D6859 Other primary thrombophilia: Secondary | ICD-10-CM

## 2023-11-09 DIAGNOSIS — I824Y9 Acute embolism and thrombosis of unspecified deep veins of unspecified proximal lower extremity: Secondary | ICD-10-CM

## 2023-11-09 MED ORDER — FOLIC ACID 1 MG PO TABS
1.0000 mg | ORAL_TABLET | Freq: Every day | ORAL | 11 refills | Status: AC
Start: 2023-11-09 — End: ?

## 2023-11-28 ENCOUNTER — Encounter: Admitting: Obstetrics and Gynecology

## 2023-12-21 ENCOUNTER — Inpatient Hospital Stay: Attending: Family

## 2023-12-21 ENCOUNTER — Ambulatory Visit (HOSPITAL_BASED_OUTPATIENT_CLINIC_OR_DEPARTMENT_OTHER)
Admission: RE | Admit: 2023-12-21 | Discharge: 2023-12-21 | Disposition: A | Discharge status: Home / Self Care | Source: Ambulatory Visit | Attending: Family | Admitting: Family

## 2023-12-21 ENCOUNTER — Other Ambulatory Visit (HOSPITAL_BASED_OUTPATIENT_CLINIC_OR_DEPARTMENT_OTHER)

## 2023-12-21 ENCOUNTER — Other Ambulatory Visit: Payer: Self-pay | Admitting: Family

## 2023-12-21 ENCOUNTER — Inpatient Hospital Stay (HOSPITAL_BASED_OUTPATIENT_CLINIC_OR_DEPARTMENT_OTHER): Admitting: Family

## 2023-12-21 ENCOUNTER — Ambulatory Visit: Payer: Self-pay

## 2023-12-21 VITALS — BP 119/73 | HR 84 | Temp 97.7°F | Resp 17 | Ht 65.0 in | Wt 309.0 lb

## 2023-12-21 DIAGNOSIS — E7211 Homocystinuria: Secondary | ICD-10-CM

## 2023-12-21 DIAGNOSIS — I824Y9 Acute embolism and thrombosis of unspecified deep veins of unspecified proximal lower extremity: Secondary | ICD-10-CM | POA: Insufficient documentation

## 2023-12-21 DIAGNOSIS — D6862 Lupus anticoagulant syndrome: Secondary | ICD-10-CM | POA: Insufficient documentation

## 2023-12-21 DIAGNOSIS — Z7901 Long term (current) use of anticoagulants: Secondary | ICD-10-CM | POA: Insufficient documentation

## 2023-12-21 DIAGNOSIS — D6859 Other primary thrombophilia: Secondary | ICD-10-CM

## 2023-12-21 DIAGNOSIS — I825Z1 Chronic embolism and thrombosis of unspecified deep veins of right distal lower extremity: Secondary | ICD-10-CM | POA: Insufficient documentation

## 2023-12-21 LAB — CBC WITH DIFFERENTIAL (CANCER CENTER ONLY)
Abs Immature Granulocytes: 0.02 K/uL (ref 0.00–0.07)
Basophils Absolute: 0.1 K/uL (ref 0.0–0.1)
Basophils Relative: 1 %
Eosinophils Absolute: 0.1 K/uL (ref 0.0–0.5)
Eosinophils Relative: 2 %
HCT: 35 % — ABNORMAL LOW (ref 36.0–46.0)
Hemoglobin: 10.9 g/dL — ABNORMAL LOW (ref 12.0–15.0)
Immature Granulocytes: 0 %
Lymphocytes Relative: 33 %
Lymphs Abs: 2.1 K/uL (ref 0.7–4.0)
MCH: 24.1 pg — ABNORMAL LOW (ref 26.0–34.0)
MCHC: 31.1 g/dL (ref 30.0–36.0)
MCV: 77.3 fL — ABNORMAL LOW (ref 80.0–100.0)
Monocytes Absolute: 0.4 K/uL (ref 0.1–1.0)
Monocytes Relative: 7 %
Neutro Abs: 3.6 K/uL (ref 1.7–7.7)
Neutrophils Relative %: 57 %
Platelet Count: 233 K/uL (ref 150–400)
RBC: 4.53 MIL/uL (ref 3.87–5.11)
RDW: 17.6 % — ABNORMAL HIGH (ref 11.5–15.5)
WBC Count: 6.3 K/uL (ref 4.0–10.5)
nRBC: 0 % (ref 0.0–0.2)

## 2023-12-21 LAB — CMP (CANCER CENTER ONLY)
ALT: 13 U/L (ref 0–44)
AST: 25 U/L (ref 15–41)
Albumin: 4 g/dL (ref 3.5–5.0)
Alkaline Phosphatase: 64 U/L (ref 38–126)
Anion gap: 8 (ref 5–15)
BUN: 9 mg/dL (ref 6–20)
CO2: 26 mmol/L (ref 22–32)
Calcium: 9.3 mg/dL (ref 8.9–10.3)
Chloride: 104 mmol/L (ref 98–111)
Creatinine: 0.68 mg/dL (ref 0.44–1.00)
GFR, Estimated: >60 mL/min (ref >60)
Glucose, Bld: 88 mg/dL (ref 70–99)
Potassium: 4.9 mmol/L (ref 3.5–5.1)
Sodium: 138 mmol/L (ref 135–145)
Total Bilirubin: 0.4 mg/dL (ref 0.0–1.2)
Total Protein: 7.6 g/dL (ref 6.5–8.1)

## 2023-12-21 LAB — LACTATE DEHYDROGENASE: LDH: 130 U/L (ref 98–192)

## 2023-12-21 NOTE — Progress Notes (Signed)
 Hematology and Oncology Follow Up Visit  Shelby Gardner [id number] 05-Apr-1976 48 y.o. 12/21/2023   Principle Diagnosis:  Recurrent RLE DVT  Positive lupus anticoagulant Low protein S activity Hyperhomocysteinemia   Current Therapy:   Xarelto  20 mg PO Daily Folic acid  1 mg PO Daily    Interim History:  Shelby Gardner is here today for follow-up. She is doing well and has no complaints at this time.  She had her repeat US  of the right lower extremity earlier today. This showed resolution of her DVT! No issue with blood loss. No bruising or petechiae.  No fever, chills, n/v, cough, rash, dizziness, SOB, chest pain, palpitations, abdominal pain or changes in bowel or bladder habits,.  No swelling, tenderness, numbness or tingling in her extremities.  No falls or syncope.  Appetite and hydration are good. Weight is stable at 309 lbs.   ECOG Performance Status: 1 - Symptomatic but completely ambulatory  Medications:  Allergies as of 12/21/2023   No Known Allergies      Medication List        Accurate as of December 21, 2023  2:12 PM. If you have any questions, ask your nurse or doctor.          ferrous sulfate 325 (65 FE) MG tablet Take 325 mg by mouth.   folic acid  1 MG tablet Commonly known as: FOLVITE  Take 1 tablet (1 mg total) by mouth daily.   Multi-Vitamins Tabs Take 1 tablet by mouth.   Naltrexone -buPROPion  HCl ER 8-90 MG Tb12 Take 2 tablets by mouth 2 (two) times daily.   omeprazole  20 MG capsule Commonly known as: PRILOSEC TAKE 1 CAPSULE BY MOUTH EVERY DAY   triamcinolone ointment 0.1 % Commonly known as: KENALOG APPLY TO AFFECTED AREA TWICE A DAY   Vitamin D (Ergocalciferol) 1.25 MG (50000 UNIT) Caps capsule Commonly known as: DRISDOL TAKE 1 CAPSULE BY MOUTH ONCE A WEEK   Xarelto  20 MG Tabs tablet Generic drug: rivaroxaban  Take 1 tablet (20 mg total) by mouth daily.        Allergies: No Known Allergies  Past Medical History, Surgical  history, Social history, and Family History were reviewed and updated.  Review of Systems: All other 10 point review of systems is negative.   Physical Exam:  height is 5' 5 (1.651 m) and weight is 309 lb (140.2 kg) (abnormal). Her oral temperature is 97.7 F (36.5 C). Her blood pressure is 119/73 and her pulse is 84. Her respiration is 17 and oxygen saturation is 100%.   Wt Readings from Last 3 Encounters:  12/21/23 (!) 309 lb (140.2 kg)  11/02/23 (!) 304 lb (137.9 kg)  08/01/18 (!) 315 lb 6.4 oz (143.1 kg)    Ocular: Sclerae unicteric, pupils equal, round and reactive to light Ear-nose-throat: Oropharynx clear, dentition fair Lymphatic: No cervical or supraclavicular adenopathy Lungs no rales or rhonchi, good excursion bilaterally Heart regular rate and rhythm, no murmur appreciated Abd soft, nontender, positive bowel sounds MSK no focal spinal tenderness, no joint edema Neuro: non-focal, well-oriented, appropriate affect Breasts: Deferred   Lab Results  Component Value Date   WBC 6.3 12/21/2023   HGB 10.9 (L) 12/21/2023   HCT 35.0 (L) 12/21/2023   MCV 77.3 (L) 12/21/2023   PLT 233 12/21/2023   Lab Results  Component Value Date   FERRITIN 1 (L) 05/06/2013   IRON <10 (L) 05/08/2013   TIBC Not calculated due to Iron <10. 05/08/2013   UIBC 386 05/08/2013   IRONPCTSAT Not  calculated due to Iron <10. 05/08/2013   Lab Results  Component Value Date   RETICCTPCT 3.4 (H) 05/06/2013   RBC 4.53 12/21/2023   No results found for: KPAFRELGTCHN, LAMBDASER, KAPLAMBRATIO No results found for: IGGSERUM, IGA, IGMSERUM No results found for: STEPHANY CARLOTA BENSON MARKEL EARLA JOANNIE DOC VICK, SPEI   Chemistry      Component Value Date/Time   NA 137 11/02/2023 1510   K 4.4 11/02/2023 1510   CL 107 11/02/2023 1510   CO2 22 11/02/2023 1510   BUN 15 11/02/2023 1510   CREATININE 0.76 11/02/2023 1510      Component Value Date/Time    CALCIUM 9.3 11/02/2023 1510   ALKPHOS 60 11/02/2023 1510   AST 15 11/02/2023 1510   ALT 11 11/02/2023 1510   BILITOT 0.2 11/02/2023 1510       Impression and Plan: Shelby Gardner is a very pleasant 48 yo female with new recurrent RLE DVT while undergoing treatment on her veins.  DVT resolved on repeat imaging today.  Hyper coag panel showed a positive lupus anticoagulant, mildly decreased protein s activity level and high homocysteine level.  Continue same regimen with Xarelto  and folic acid  daily.   Lauraine Pepper, NP 7/25/20252:12 PM

## 2023-12-22 LAB — LUPUS ANTICOAGULANT PANEL
DRVVT: 76.4 s — ABNORMAL HIGH (ref 0.0–47.0)
PTT Lupus Anticoagulant: 33.1 s (ref 0.0–43.5)

## 2023-12-22 LAB — DRVVT CONFIRM: dRVVT Confirm: 1.1 ratio (ref 0.8–1.2)

## 2023-12-22 LAB — PROTEIN S ACTIVITY: Protein S Activity: 87 % (ref 63–140)

## 2023-12-22 LAB — HOMOCYSTEINE

## 2023-12-22 LAB — DRVVT MIX: dRVVT Mix: 64 s — ABNORMAL HIGH (ref 0.0–40.4)

## 2023-12-22 LAB — PROTEIN S, TOTAL: Protein S Ag, Total: 86 % (ref 60–150)

## 2023-12-24 LAB — HOMOCYSTEINE: Homocysteine: 19.6 umol/L — ABNORMAL HIGH (ref 0.0–14.5)

## 2024-06-23 ENCOUNTER — Inpatient Hospital Stay

## 2024-06-23 ENCOUNTER — Inpatient Hospital Stay: Admitting: Family
